# Patient Record
Sex: Female | Born: 1980
Health system: Southern US, Community
[De-identification: ages and names within clinical notes are randomized; demographics above are authoritative.]

## PROBLEM LIST (undated history)

## (undated) DIAGNOSIS — Z9889 Other specified postprocedural states: Secondary | ICD-10-CM

## (undated) DIAGNOSIS — F419 Anxiety disorder, unspecified: Secondary | ICD-10-CM

## (undated) DIAGNOSIS — R87629 Unspecified abnormal cytological findings in specimens from vagina: Secondary | ICD-10-CM

## (undated) HISTORY — PX: EYE SURGERY: SHX253

## (undated) HISTORY — PX: COLPOSCOPY: SHX161

---
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
## 2001-06-04 ENCOUNTER — Other Ambulatory Visit: Admission: RE | Admit: 2001-06-04 | Discharge: 2001-06-04 | Payer: Self-pay | Admitting: Obstetrics and Gynecology

## 2002-07-14 ENCOUNTER — Other Ambulatory Visit: Admission: RE | Admit: 2002-07-14 | Discharge: 2002-07-14 | Payer: Self-pay | Admitting: Obstetrics and Gynecology

## 2003-08-08 ENCOUNTER — Other Ambulatory Visit: Admission: RE | Admit: 2003-08-08 | Discharge: 2003-08-08 | Payer: Self-pay | Admitting: Obstetrics and Gynecology

## 2003-11-23 ENCOUNTER — Other Ambulatory Visit: Admission: RE | Admit: 2003-11-23 | Discharge: 2003-11-23 | Payer: Self-pay | Admitting: Obstetrics and Gynecology

## 2004-12-12 ENCOUNTER — Other Ambulatory Visit: Admission: RE | Admit: 2004-12-12 | Discharge: 2004-12-12 | Payer: Self-pay | Admitting: Obstetrics and Gynecology

## 2014-12-22 ENCOUNTER — Inpatient Hospital Stay (HOSPITAL_COMMUNITY)
Admission: AD | Admit: 2014-12-22 | Discharge: 2014-12-22 | Disposition: A | Discharge status: Home / Self Care | Payer: BLUE CROSS/BLUE SHIELD | Source: Ambulatory Visit | Attending: Obstetrics and Gynecology | Admitting: Obstetrics and Gynecology

## 2014-12-22 ENCOUNTER — Encounter (HOSPITAL_COMMUNITY): Payer: Self-pay | Admitting: *Deleted

## 2014-12-22 DIAGNOSIS — O208 Other hemorrhage in early pregnancy: Secondary | ICD-10-CM | POA: Diagnosis not present

## 2014-12-22 DIAGNOSIS — R300 Dysuria: Secondary | ICD-10-CM | POA: Insufficient documentation

## 2014-12-22 DIAGNOSIS — Z3A11 11 weeks gestation of pregnancy: Secondary | ICD-10-CM | POA: Insufficient documentation

## 2014-12-22 DIAGNOSIS — O209 Hemorrhage in early pregnancy, unspecified: Secondary | ICD-10-CM

## 2014-12-22 DIAGNOSIS — O4691 Antepartum hemorrhage, unspecified, first trimester: Secondary | ICD-10-CM

## 2014-12-22 DIAGNOSIS — Z87891 Personal history of nicotine dependence: Secondary | ICD-10-CM | POA: Insufficient documentation

## 2014-12-22 DIAGNOSIS — N939 Abnormal uterine and vaginal bleeding, unspecified: Secondary | ICD-10-CM | POA: Diagnosis present

## 2014-12-22 DIAGNOSIS — R350 Frequency of micturition: Secondary | ICD-10-CM

## 2014-12-22 HISTORY — DX: Unspecified abnormal cytological findings in specimens from vagina: R87.629

## 2014-12-22 LAB — URINALYSIS, ROUTINE W REFLEX MICROSCOPIC
Bilirubin Urine: NEGATIVE
Glucose, UA: NEGATIVE mg/dL
Ketones, ur: NEGATIVE mg/dL
Leukocytes, UA: NEGATIVE
Nitrite: NEGATIVE
Protein, ur: NEGATIVE mg/dL
Specific Gravity, Urine: <1.005 — ABNORMAL LOW (ref 1.005–1.030)
Urobilinogen, UA: 0.2 mg/dL (ref 0.0–1.0)
pH: 6 (ref 5.0–8.0)

## 2014-12-22 LAB — URINE MICROSCOPIC-ADD ON

## 2014-12-22 NOTE — MAU Note (Signed)
Pt stated she started having some bleeding today. Has been having some pain and cramping and some burning with urination. Stated she feels like she may have a UTI. Reports she has been told she had a subchorionic hemorrhage  with this pregnancy and has 2 other episodes of bleeding.

## 2014-12-22 NOTE — Discharge Instructions (Signed)
Pelvic Rest °Pelvic rest is sometimes recommended for women when:  °· The placenta is partially or completely covering the opening of the cervix (placenta previa). °· There is bleeding between the uterine wall and the amniotic sac in the first trimester (subchorionic hemorrhage). °· The cervix begins to open without labor starting (incompetent cervix, cervical insufficiency). °· The labor is too early (preterm labor). °HOME CARE INSTRUCTIONS °· Do not have sexual intercourse, stimulation, or an orgasm. °· Do not use tampons, douche, or put anything in the vagina. °· Do not lift anything over 10 pounds (4.5 kg). °· Avoid strenuous activity or straining your pelvic muscles. °SEEK MEDICAL CARE IF:  °· You have any vaginal bleeding during pregnancy. Treat this as a potential emergency. °· You have cramping pain felt low in the stomach (stronger than menstrual cramps). °· You notice vaginal discharge (watery, mucus, or bloody). °· You have a low, dull backache. °· There are regular contractions or uterine tightening. °SEEK IMMEDIATE MEDICAL CARE IF: °You have vaginal bleeding and have placenta previa.  °Document Released: 08/02/2010 Document Revised: 06/30/2011 Document Reviewed: 08/02/2010 °ExitCare® Patient Information ©2015 [employer]. This information is not intended to replace advice given to you by your health care provider. Make sure you discuss any questions you have with your health care provider. ° °Vaginal Bleeding During Pregnancy, First Trimester °A small amount of bleeding (spotting) from the vagina is common in early pregnancy. Sometimes the bleeding is normal and is not a problem, and sometimes it is a sign of something serious. Be sure to tell your doctor about any bleeding from your vagina right away. °HOME CARE °· Watch your condition for any changes. °· Follow your doctor's instructions about how active you can be. °· If you are on bed rest: °¨ You may need to stay in bed and only get up to use the  bathroom. °¨ You may be allowed to do some activities. °¨ If you need help, make plans for someone to help you. °· Write down: °¨ The number of pads you use each day. °¨ How often you change pads. °¨ How soaked (saturated) your pads are. °· Do not use tampons. °· Do not douche. °· Do not have sex or orgasms until your doctor says it is okay. °· If you pass any tissue from your vagina, save the tissue so you can show it to your doctor. °· Only take medicines as told by your doctor. °· Do not take aspirin because it can make you bleed. °· Keep all follow-up visits as told by your doctor. °GET HELP IF:  °· You bleed from your vagina. °· You have cramps. °· You have labor pains. °· You have a fever that does not go away after you take medicine. °GET HELP RIGHT AWAY IF:  °· You have very bad cramps in your back or belly (abdomen). °· You pass large clots or tissue from your vagina. °· You bleed more. °· You feel light-headed or weak. °· You pass out (faint). °· You have chills. °· You are leaking fluid or have a gush of fluid from your vagina. °· You pass out while pooping (having a bowel movement). °MAKE SURE YOU: °· Understand these instructions. °· Will watch your condition. °· Will get help right away if you are not doing well or get worse. °Document Released: 08/22/2013 Document Reviewed: 12/13/2012 °ExitCare® Patient Information ©2015 [employer]. This information is not intended to replace advice given to you by your health care provider. Make   sure you discuss any questions you have with your health care provider. ° °

## 2014-12-22 NOTE — MAU Provider Note (Signed)
History     CSN: [id number]  Arrival date and time: 12/22/14 1502   First Provider Initiated Contact with Patient 12/22/14 1545      No chief complaint on file.  Patient is a 34 y.o. female presenting with vaginal bleeding. The history is provided by the patient.  Vaginal Bleeding This is a recurrent problem. The current episode started today. The problem occurs intermittently. The problem has been gradually improving. Associated symptoms include abdominal pain. Pertinent negatives include no chills, fever, nausea or vomiting. Nothing aggravates the symptoms. She has tried nothing for the symptoms.   This is a 34 y.o. female at [redacted]w[redacted]d who presents with c/o vaginal bleeding.  Also has been having some cramping and some dysuria. Has a known subchorionic hemorrhage and was told it was smaller on most recent US than the first [place].   RN Note: Pt stated she started having some bleeding today. Has been having some pain and cramping and some burning with urination. Stated she feels like she may have a UTI. Reports she has been told she had a subchorionic hemorrhage with this pregnancy and has 2 other episodes of bleeding.           OB History    Gravida Para Term Preterm AB TAB SAB Ectopic Multiple Living   Past Medical History  Diagnosis Date  . Medical history non-contributory   . Vaginal Pap smear, abnormal     Past Surgical History  Procedure Laterality Date  . Colposcopy      No family history on file.  Social History  Substance Use Topics  . Smoking status: Former Smoker    Quit date: 04/23/1999  . Smokeless tobacco: None  . Alcohol Use: No    Allergies: Allergies not on file  No prescriptions prior to admission    Review of Systems  Constitutional: Negative for fever, chills and malaise/fatigue.  Gastrointestinal: Positive for abdominal pain. Negative for nausea, vomiting, diarrhea and constipation.  Genitourinary: Positive for dysuria and  vaginal bleeding. Negative for urgency, frequency, hematuria and flank pain.   Physical Exam   Blood pressure 131/77, pulse 78, resp. rate 18, height  (1.6 m), last menstrual period 09/30/2014.  Physical Exam  Constitutional: She is oriented to person, place, and time. She appears well-developed and well-nourished. No distress.  HENT:  Head: Normocephalic.  Cardiovascular: Normal rate and regular rhythm.   Respiratory: Effort normal. No respiratory distress.  GI: Soft. She exhibits no distension and no mass. There is no tenderness. There is no rebound and no guarding.  Genitourinary: Vaginal discharge (small amount of dark red blood in vault) found.  Doppler FHTs obtained by RN Informal bedside US shows active fetus  Musculoskeletal: Normal range of motion.  Neurological: She is alert and oriented to person, place, and time.  Skin: Skin is warm and dry.  Psychiatric: She has a normal mood and affect.    MAU Course  Procedures  MDM Results for orders placed or performed during the hospital encounter of 12/22/14 (from the past 24 hour(s))  Urinalysis, Routine w reflex microscopic (not at [facility])     Status: Abnormal   Collection Time: 12/22/14  3:00 PM  Result Value Ref Range   Color, Urine STRAW (A) YELLOW   APPearance CLEAR CLEAR   Specific Gravity, Urine <1.005 (L) 1.005 - 1.030   pH 6.0 5.0 - 8.0   Glucose, UA NEGATIVE  NEGATIVE mg/dL   Hgb urine dipstick LARGE (A) NEGATIVE   Bilirubin Urine NEGATIVE NEGATIVE   Ketones, ur NEGATIVE NEGATIVE mg/dL   Protein, ur NEGATIVE NEGATIVE mg/dL   Urobilinogen, UA 0.2 0.0 - 1.0 mg/dL   Nitrite NEGATIVE NEGATIVE   Leukocytes, UA NEGATIVE NEGATIVE  Urine microscopic-add on     Status: Abnormal   Collection Time: 12/22/14  3:00 PM  Result Value Ref Range   Squamous Epithelial / LPF FEW (A) RARE     Assessment and Plan  A:  SIUP at [redacted]w[redacted]d       First trimester bleeding in setting of known subchorionic hemorrhage       Dysuria,  with no evidence of UTI  P:  Consulted Dr Arelia Sneddon regarding presentation , history, exam findings and test results.        DIscharge home       BLeeding precautions.        Follow up in office for repeat US next week as scheduled   [facility] 12/22/2014, 3:45 PM

## 2014-12-27 ENCOUNTER — Inpatient Hospital Stay (HOSPITAL_COMMUNITY)
Admission: AD | Admit: 2014-12-27 | Payer: BLUE CROSS/BLUE SHIELD | Source: Ambulatory Visit | Admitting: Obstetrics and Gynecology

## 2015-03-27 ENCOUNTER — Inpatient Hospital Stay (HOSPITAL_COMMUNITY): Payer: BLUE CROSS/BLUE SHIELD

## 2015-03-27 ENCOUNTER — Encounter (HOSPITAL_COMMUNITY): Payer: Self-pay | Admitting: *Deleted

## 2015-03-27 ENCOUNTER — Inpatient Hospital Stay (HOSPITAL_COMMUNITY)
Admission: AD | Admit: 2015-03-27 | Discharge: 2015-04-02 | DRG: 781 | Disposition: A | Discharge status: Home / Self Care | Payer: BLUE CROSS/BLUE SHIELD | Source: Ambulatory Visit | Attending: Obstetrics and Gynecology | Admitting: Obstetrics and Gynecology

## 2015-03-27 DIAGNOSIS — Z833 Family history of diabetes mellitus: Secondary | ICD-10-CM

## 2015-03-27 DIAGNOSIS — O42912 Preterm premature rupture of membranes, unspecified as to length of time between rupture and onset of labor, second trimester: Secondary | ICD-10-CM | POA: Diagnosis present

## 2015-03-27 DIAGNOSIS — O42919 Preterm premature rupture of membranes, unspecified as to length of time between rupture and onset of labor, unspecified trimester: Secondary | ICD-10-CM | POA: Diagnosis present

## 2015-03-27 DIAGNOSIS — O4402 Placenta previa specified as without hemorrhage, second trimester: Secondary | ICD-10-CM | POA: Diagnosis not present

## 2015-03-27 DIAGNOSIS — Z3A25 25 weeks gestation of pregnancy: Secondary | ICD-10-CM

## 2015-03-27 DIAGNOSIS — Z87891 Personal history of nicotine dependence: Secondary | ICD-10-CM | POA: Diagnosis not present

## 2015-03-27 DIAGNOSIS — Z8249 Family history of ischemic heart disease and other diseases of the circulatory system: Secondary | ICD-10-CM | POA: Diagnosis not present

## 2015-03-27 DIAGNOSIS — O26892 Other specified pregnancy related conditions, second trimester: Secondary | ICD-10-CM | POA: Diagnosis present

## 2015-03-27 DIAGNOSIS — Z3A26 26 weeks gestation of pregnancy: Secondary | ICD-10-CM

## 2015-03-27 DIAGNOSIS — O4290 Premature rupture of membranes, unspecified as to length of time between rupture and onset of labor, unspecified weeks of gestation: Secondary | ICD-10-CM

## 2015-03-27 HISTORY — DX: Anxiety disorder, unspecified: F41.9

## 2015-03-27 HISTORY — DX: Other specified postprocedural states: Z98.890

## 2015-03-27 LAB — URINALYSIS, ROUTINE W REFLEX MICROSCOPIC
Bilirubin Urine: NEGATIVE
Glucose, UA: NEGATIVE mg/dL
Hgb urine dipstick: NEGATIVE
Ketones, ur: NEGATIVE mg/dL
Leukocytes, UA: NEGATIVE
Nitrite: NEGATIVE
Protein, ur: NEGATIVE mg/dL
Specific Gravity, Urine: 1.01 (ref 1.005–1.030)
pH: 7 (ref 5.0–8.0)

## 2015-03-27 LAB — ABO/RH: ABO/RH(D): A POS

## 2015-03-27 LAB — AMNISURE RUPTURE OF MEMBRANE (ROM) NOT AT ARMC: Amnisure ROM: POSITIVE

## 2015-03-27 LAB — CBC
HCT: 35.5 % — ABNORMAL LOW (ref 36.0–46.0)
Hemoglobin: 12.3 g/dL (ref 12.0–15.0)
MCH: 31.5 pg (ref 26.0–34.0)
MCHC: 34.6 g/dL (ref 30.0–36.0)
MCV: 91 fL (ref 78.0–100.0)
Platelets: 217 10*3/uL (ref 150–400)
RBC: 3.9 MIL/uL (ref 3.87–5.11)
RDW: 13.3 % (ref 11.5–15.5)
WBC: 12.9 10*3/uL — ABNORMAL HIGH (ref 4.0–10.5)

## 2015-03-27 LAB — TYPE AND SCREEN
ABO/RH(D): A POS
Antibody Screen: NEGATIVE

## 2015-03-27 LAB — WET PREP, GENITAL
Clue Cells Wet Prep HPF POC: NONE SEEN
Sperm: NONE SEEN
Trich, Wet Prep: NONE SEEN

## 2015-03-27 MED ORDER — DOCUSATE SODIUM 100 MG PO CAPS
100.0000 mg | ORAL_CAPSULE | Freq: Every day | ORAL | Status: DC
  Administered 2015-03-28 – 2015-04-02 (×6): 100 mg via ORAL
  Filled 2015-03-27 (×7): qty 1

## 2015-03-27 MED ORDER — AMOXICILLIN 500 MG PO CAPS
500.0000 mg | ORAL_CAPSULE | Freq: Three times a day (TID) | ORAL | Status: DC
  Administered 2015-03-29 – 2015-04-02 (×14): 500 mg via ORAL
  Filled 2015-03-27 (×16): qty 1

## 2015-03-27 MED ORDER — SODIUM CHLORIDE 0.9 % IV SOLN
2.0000 g | Freq: Four times a day (QID) | INTRAVENOUS | Status: AC
  Administered 2015-03-27 – 2015-03-29 (×8): 2 g via INTRAVENOUS
  Filled 2015-03-27 (×8): qty 2000

## 2015-03-27 MED ORDER — HOME MED STORE IN PYXIS: 1.0000 | Freq: Two times a day (BID) | Status: DC

## 2015-03-27 MED ORDER — BETAMETHASONE SOD PHOS & ACET 6 (3-3) MG/ML IJ SUSP
12.0000 mg | Freq: Once | INTRAMUSCULAR | Status: DC
  Administered 2015-03-27: 12 mg via INTRAMUSCULAR
  Filled 2015-03-27: qty 2

## 2015-03-27 MED ORDER — DEXTROSE 5 % IV SOLN
500.0000 mg | INTRAVENOUS | Status: AC
  Administered 2015-03-27 – 2015-03-28 (×2): 500 mg via INTRAVENOUS
  Filled 2015-03-27 (×2): qty 500

## 2015-03-27 MED ORDER — HOME MED STORE IN PYXIS: 1.0000 | Freq: Every morning | Status: DC

## 2015-03-27 MED ORDER — ZOLPIDEM TARTRATE 5 MG PO TABS: 5.0000 mg | ORAL_TABLET | Freq: Every evening | ORAL | Status: DC | PRN

## 2015-03-27 MED ORDER — PRENATAL+DHA 28-0.975 & 200 MG PO MISC: 1.0000 | Freq: Two times a day (BID) | ORAL | Status: DC

## 2015-03-27 MED ORDER — ACETAMINOPHEN 325 MG PO TABS
650.0000 mg | ORAL_TABLET | ORAL | Status: DC | PRN
Start: 2015-03-27 — End: 2015-04-02

## 2015-03-27 MED ORDER — PRENATAL MULTIVITAMIN CH
1.0000 | ORAL_TABLET | Freq: Every day | ORAL | Status: DC
  Administered 2015-04-01: 1 via ORAL
  Filled 2015-03-27 (×5): qty 1

## 2015-03-27 MED ORDER — CALCIUM CARBONATE ANTACID 500 MG PO CHEW
2.0000 | CHEWABLE_TABLET | ORAL | Status: DC | PRN
  Filled 2015-03-27: qty 2

## 2015-03-27 MED ORDER — BETAMETHASONE SOD PHOS & ACET 6 (3-3) MG/ML IJ SUSP
12.0000 mg | INTRAMUSCULAR | Status: AC
  Administered 2015-03-28: 12 mg via INTRAMUSCULAR
  Filled 2015-03-27 (×2): qty 2

## 2015-03-27 MED ORDER — AZITHROMYCIN 250 MG PO TABS
500.0000 mg | ORAL_TABLET | Freq: Every day | ORAL | Status: AC
  Administered 2015-03-29 – 2015-04-02 (×5): 500 mg via ORAL
  Filled 2015-03-27 (×5): qty 2

## 2015-03-27 MED ORDER — DOCOSAHEXAENOIC ACID 300 MG PO CAPS
1.0000 | ORAL_CAPSULE | Freq: Two times a day (BID) | ORAL | Status: DC
  Administered 2015-03-27 – 2015-04-02 (×12): 1 via ORAL

## 2015-03-27 NOTE — MAU Provider Note (Signed)
  History     CSN: [id number]  Arrival date and time: 03/27/15 0345   First Provider Initiated Contact with Patient 03/27/15 [phone]      Chief Complaint  Patient presents with  . Vaginal Bleeding   Vaginal Bleeding The patient's primary symptoms include vaginal bleeding and vaginal discharge. This is a new problem. The current episode started today (around 0300 ). The problem occurs intermittently. The problem has been unchanged. The patient is experiencing no pain. She is pregnant. The vaginal discharge was brown and mucoid. The vaginal bleeding is spotting. She has not been passing clots. She has not been passing tissue. Nothing aggravates the symptoms. She has tried nothing for the symptoms. Sexual activity: on pelvic rest     Past Medical History  Diagnosis Date  . Medical history non-contributory   . Vaginal Pap smear, abnormal     Past Surgical History  Procedure Laterality Date  . Colposcopy    . Eye surgery      as child    No family history on file.  Social History  Substance Use Topics  . Smoking status: Former Smoker    Quit date: 04/23/1999  . Smokeless tobacco: None  . Alcohol Use: No    Allergies: No Known Allergies  Prescriptions prior to admission  Medication Sig Dispense Refill Last Dose  . Docosahexaenoic Acid (PRENATAL DHA PO) Take 1 tablet by mouth 2 (two) times daily.   12/22/2014 at Unknown time  . Prenatal Vit-Fe Fumarate-FA (PRENATAL MULTIVITAMIN) TABS tablet Take 1 tablet by mouth daily at 12 noon.   12/22/2014 at Unknown time    Review of Systems  Genitourinary: Positive for vaginal bleeding and vaginal discharge.   Physical Exam   Blood pressure 113/67, pulse 79, temperature 98.3 F (36.8 C), temperature source Oral, resp. rate 20, height 5\' 4"  (1.626 m), weight 75.467 kg (166 lb 6 oz), last menstrual period 09/30/2014.  Physical Exam  Nursing note and vitals reviewed. Constitutional: She is oriented to person, place, and time. She appears  well-developed and well-nourished. No distress.  HENT:  Head: Normocephalic.  Cardiovascular: Normal rate.   Respiratory: Effort normal.  GI: Soft. There is no tenderness. There is no rebound.  Genitourinary:   External: no lesion Vagina: large amount of thick, yellow discharge.   Cervix: some ectropion to the cervix  Uterus: AGA    Neurological: She is alert and oriented to person, place, and time.  Skin: Skin is warm and dry.  Psychiatric: She has a normal mood and affect.   FHT: 135, moderate with 10x10 accels, occasional variable.  Toco: no UCs   US: AFI 10.67, 14%tile Cervical length 3.9cm Anterior previa seen   MAU Course  Procedures  MDM 0600: D/W Dr. Vincente PoliGrewal, will get US for AFI. 0658: Reviewed US results with Dr. Vincente PoliGrewal, will admit to ante for abx and steroids.  Assessment and Plan  PPROM Admit to antenatal  Steriods Abx   Tawnya CrookHogan, Heather Donovan 03/27/2015, 4:59 AM

## 2015-03-27 NOTE — Consult Note (Signed)
Maternal Fetal Medicine Consultation  Requesting Provider(s): Harold Hedge, MD  Reason for consultation: Placenta previa, suspected PROM  HPI: Colleen Dalton is a 34 yo G4P0030, EDD 07/07/2015 who is currently at 25w 3d seen for consultation due to known anterior placenta previa and suspected PROM. Colleen Dalton reports some vaginal spotting and discharge last night.  On evaluation, she was noted to have a mucous discharge suspicious for yeast vaginitis and positive amnisure test.  Limited ultrasound showed an AFI of 10 cm (~14th %tile).  She otherwise denies leakage of fluid or other symptoms suspicious for ruptured membranes.  She is otherwise without complaints.  The patient was admitted, started on latency antibiotics and given a course of betamethasone for PROM.  Her prenatal course has otherwise been uncomplicated.  OB History: OB History    Gravida Para Term Preterm AB TAB SAB Ectopic Multiple Living   PMH:  Past Medical History  Diagnosis Date  . Vaginal Pap smear, abnormal   . Anxiety   . History of cryosurgery     PSH:  Past Surgical History  Procedure Laterality Date  . Colposcopy    . Eye surgery      as child   Meds:  Scheduled Meds: . ampicillin (OMNIPEN) IV  2 g Intravenous Q6H   Followed by  . [START ON 03/29/2015] amoxicillin  500 mg Oral Q8H  . azithromycin  500 mg Intravenous Q24H   Followed by  . [START ON 03/29/2015] azithromycin  500 mg Oral Daily  . betamethasone acetate-betamethasone sodium phosphate  12 mg Intramuscular Q24H  . Docosahexaenoic Acid  1 capsule Oral BID WC  . docusate sodium  100 mg Oral Daily  . prenatal multivitamin  1 tablet Oral Q1200   Continuous Infusions:  PRN Meds:.acetaminophen, calcium carbonate, zolpidem   Allergies: No Known Allergies   FH:  Family History  Problem Relation Age of Onset  . Asthma Father   . Cancer Father     prostate and colon  . Deep vein thrombosis Maternal Aunt   . Cancer Maternal  Aunt     x2 aunts thyroid cancer  . Alcohol abuse Paternal Aunt   . Alcohol abuse Paternal Uncle   . Asthma Maternal Grandmother   . Kidney disease Maternal Grandmother   . Heart disease Maternal Grandmother   . Diabetes Maternal Grandmother   . Kidney disease Maternal Grandfather    Soc:  Social History   Social History  . Marital Status: Married    Spouse Name: N/A  . Number of Children: N/A  . Years of Education: N/A   Occupational History  . Not on file.   Social History Main Topics  . Smoking status: Former Smoker    Quit date: 04/23/1999  . Smokeless tobacco: Not on file  . Alcohol Use: No  . Drug Use: No  . Sexual Activity: Yes    Birth Control/ Protection: None   Other Topics Concern  . Not on file   Social History Narrative   Review of Systems: no vaginal bleeding or cramping/contractions, no LOF, no nausea/vomiting. All other systems reviewed and are negative.  PE:   Filed Vitals:   03/27/15 0808 03/27/15 1150  BP: 106/56 120/74  Pulse: 66 92  Temp: 98 F (36.7 C) 98.4 F (36.9 C)  Resp: 16 16    Labs: CBC    Component Value Date/Time   WBC 12.9* 03/27/2015  0720   RBC 3.90 03/27/2015 0720   HGB 12.3 03/27/2015 0720   HCT 35.5* 03/27/2015 0720   PLT 217 03/27/2015 0720   MCV 91.0 03/27/2015 0720   MCH 31.5 03/27/2015 0720   MCHC 34.6 03/27/2015 0720   RDW 13.3 03/27/2015 0720    A/P: 1) Single IUP at 25w 3d  2) Anterior placenta previa  3) Suspected PROM -  It is difficult to dismiss the positive amnisure test, and feel that admission for latency antibiotics and betamethasone is reasonable.  The patient currently does not have any other clinical findings suggestive of PROM.  Would consider repeating AFI and sterile speculum exam and amnisure test once latency antibiotics are complete - I would expect that if she is indeed ruptured she would declare herself (i.e. worsening oligohydramnios or additional findings on speculum exam).  We briefly  discussed risks and management of PROM to include risks of infection, labor, cord prolapse, abruption as well as the potential need for classical cesarean delivery in the event that delivery were required.  The patient had a number of questions regarding neonatal outcomes - would consider NICU consult, particularly if the diagnosis of PROM is confirmed.    Thank you for the opportunity to be a part of the care of Colleen Dalton. Please contact our office if we can be of further assistance.   I spent approximately 30 minutes with this patient with over 50% of time spent in face-to-face counseling.  Alpha GulaPaul Maddix Kliewer, MD Maternal Fetal Medicine

## 2015-03-27 NOTE — MAU Note (Addendum)
AT 0300     NOTICED   BROWN D/C WHEN SHE  WIPED- DIME SIZED  AMT .    HAD BAD SMELL.    HAS    COMPLETE   PLACENTA  PREVIA  -   NO   SEX  SINCE 18 WEEKS.  CALLED  NURSE  LINE-  TOLD  TO COME.      FEELS   LOWER  ABD  CRAMPING.- PUT  WARM COMPRESS.

## 2015-03-27 NOTE — Progress Notes (Signed)
No further D/C or leaking/bleeding  VSS Afeb  Appreciate Dr Fredda HammedWhitecar's consultation  A/P: toco monitor tonight         NST q shift        Finish latency ATB        BMTZ        U/S tomorrow        Neonatology consult for tomorrow

## 2015-03-27 NOTE — H&P (Signed)
Colleen Dalton is a 34 y.o. female presenting for Thick dark, but not bloody,gelatinous discharge about 3 am. Has recurred intermittently. No gushes , no bright red or dark red discharge. No trauma. Slight pulling in LLQ on/off but no uterine cramping. Known placenta previa. Maternal Medical History:  Reason for admission: Rupture of membranes.   Contractions: Onset was 3-5 hours ago.    Fetal activity: Perceived fetal activity is normal.    Prenatal complications: No bleeding.     OB History    Gravida Para Term Preterm AB TAB SAB Ectopic Multiple Living   3    2  2         Past Medical History  Diagnosis Date  . Medical history non-contributory   . Vaginal Pap smear, abnormal    Past Surgical History  Procedure Laterality Date  . Colposcopy    . Eye surgery      as child   Family History: family history includes Alcohol abuse in her paternal aunt and paternal uncle; Asthma in her father and maternal grandmother; Deep vein thrombosis in her maternal aunt; Diabetes in her maternal grandmother; Heart disease in her maternal grandmother; Kidney disease in her maternal grandfather and maternal grandmother. Social History:  reports that she quit smoking about 15 years ago. She does not have any smokeless tobacco history on file. She reports that she does not drink alcohol or use illicit drugs.   Prenatal Transfer Tool  Maternal Diabetes: No Genetic Screening: Normal Maternal Ultrasounds/Referrals: Abnormal:  Findings:   Other:placenta previa Fetal Ultrasounds or other Referrals:  None Maternal Substance Abuse:  No Significant Maternal Medications:  None Significant Maternal Lab Results:  None Other Comments:  None  Review of Systems  Eyes: Negative for blurred vision.  Gastrointestinal: Negative for abdominal pain.  Neurological: Negative for headaches.      Blood pressure 106/56, pulse 66, temperature 98 F (36.7 C), temperature source Oral, resp. rate 16, height 5\' 4"   (1.626 m), weight 166 lb 6 oz (75.467 kg), last menstrual period 09/30/2014.   Fetal Exam Fetal Monitor Review: Pattern: accelerations present.       Physical Exam  Cardiovascular: Normal rate and regular rhythm.   Respiratory: Effort normal and breath sounds normal.  GI: Soft. There is no tenderness.  Neurological: She has normal reflexes.    Exam in MAU noted thick yellow vaginal discharge US: AFI 10.67, 14%tile Cervical length 3.9cm Anterior previa seen  Written report pending  Prenatal labs: ABO, Rh:   Antibody:   Rubella:   RPR:    HBsAg:    HIV:    GBS:     Assessment/Plan: 34 yo G3P0 @ 25 3/7 weeks with placenta previa and possible PROM Admit to antenatal IV fluids Modified BR ATB per protocol BMTZ Repeat U/S to check AFI tomorrow MFM consult D/W patient and husband above, D/W C/S for delivery-probable classical uterine incision with repeat C/S in future, possible bleeding with transfusion and HIV/Hep risk, possible hysterectomy   Starr Urias II,Jonanthony Nahar E 03/27/2015, 8:22 AM

## 2015-03-28 ENCOUNTER — Inpatient Hospital Stay (HOSPITAL_COMMUNITY): Payer: BLUE CROSS/BLUE SHIELD

## 2015-03-28 MED ORDER — SODIUM CHLORIDE 0.9 % IJ SOLN
3.0000 mL | INTRAMUSCULAR | Status: DC | PRN
  Administered 2015-03-28: 3 mL via INTRAVENOUS
  Filled 2015-03-28: qty 3

## 2015-03-28 MED ORDER — LACTATED RINGERS IV SOLN
INTRAVENOUS | Status: DC
  Administered 2015-03-28: 08:00:00 via INTRAVENOUS

## 2015-03-28 MED ORDER — SODIUM CHLORIDE 0.9 % IJ SOLN
3.0000 mL | Freq: Two times a day (BID) | INTRAMUSCULAR | Status: DC
  Administered 2015-03-29 – 2015-03-30 (×3): 3 mL via INTRAVENOUS

## 2015-03-28 NOTE — Progress Notes (Signed)
IVF's initiated to hang with antibiotics.

## 2015-03-28 NOTE — Progress Notes (Signed)
7033w4d  S// no signif further D/C or leaking, just returned from US  O//BP 108/66 mmHg  Pulse 84  Temp(Src) 98.4 F (36.9 C) (Oral)  Resp 18  Ht 5\' 4"  (1.626 m)  Wt 166 lb 6 oz (75.467 kg)  BMI 28.54 kg/m2  LMP 09/30/2014 CBC    Component Value Date/Time   WBC 12.9* 03/27/2015 0720   RBC 3.90 03/27/2015 0720   HGB 12.3 03/27/2015 0720   HCT 35.5* 03/27/2015 0720   PLT 217 03/27/2015 0720   MCV 91.0 03/27/2015 0720   MCH 31.5 03/27/2015 0720   MCHC 34.6 03/27/2015 0720   RDW 13.3 03/27/2015 0720    A+P//5533w4d, ant plac previa with questionable PPROM  Per MFM>>>   " 1) Single IUP at 25w 3d 2) Anterior placenta previa 3) Suspected PROM - It is difficult to dismiss the positive amnisure test, and feel that admission for latency antibiotics and betamethasone is reasonable. The patient currently does not have any other clinical findings suggestive of PROM. Would consider repeating AFI and sterile speculum exam and amnisure test once latency antibiotics are complete

## 2015-03-29 NOTE — Progress Notes (Signed)
Patient ID: Henrietta HooverArielle Benninger, female   DOB: 1980-08-13, 34 y.o.   MRN: [id number] Pt without complaints GFM No bleeding or leaking occas mucous  VSSAF FHR 140s no decels Ctxs none  Abd Gravid, nt Neg homans  IUP at 25 5/7 Anterior Previa - stable without bleeding ? PPROM - S/P BMZ series, currently on latency abx.  No leaking of fluid and normal AFI Plan to recheck sterile spec and Amnisure after completion of latency abx (per MFM)  DL

## 2015-03-30 ENCOUNTER — Encounter (HOSPITAL_COMMUNITY): Payer: Self-pay | Admitting: General Surgery

## 2015-03-30 LAB — TYPE AND SCREEN
ABO/RH(D): A POS
Antibody Screen: NEGATIVE

## 2015-03-30 NOTE — Progress Notes (Signed)
Pt denies vb, ctx, & lof.  + FM  AF, VSS Gen - NAD Abd - gravid, NT PV - deferred  A/P:  Placenta previa, questionable PPROM S/p BMZ Continue latency abx x 7days Rpt US, amniosure after latency abx monday

## 2015-03-31 NOTE — Progress Notes (Signed)
Pt c/o increase mucus d/c yesterday;  No further vb.  + FM  AF, VSS Gen - NAD Abd - gravid, NT Ext - NT, no edema  A/P:  Previa, ? PPROM Continue 7 days latency abx, rpt amniosure Monday morning. S/p BMZ

## 2015-04-01 NOTE — Progress Notes (Signed)
Pt continues to have occasional mucus fluid discharge.  No VB.  No pain  AF, VSS Gen - NAD Abd - gravid, NT Ext - NT, SCDs on  A/P:  Placenta previa, PPROM Plan to complete latency abx today  Rpt amniosure tomorrow to make plan of care (?PPROM) S/p BMZ

## 2015-04-02 ENCOUNTER — Inpatient Hospital Stay (HOSPITAL_COMMUNITY): Payer: BLUE CROSS/BLUE SHIELD

## 2015-04-02 LAB — AMNISURE RUPTURE OF MEMBRANE (ROM) NOT AT ARMC: Amnisure ROM: NEGATIVE

## 2015-04-02 LAB — TYPE AND SCREEN
ABO/RH(D): A POS
Antibody Screen: NEGATIVE

## 2015-04-02 MED ORDER — AMOXICILLIN 500 MG PO CAPS: 500.0000 mg | ORAL_CAPSULE | Freq: Three times a day (TID) | ORAL | Status: DC

## 2015-04-02 NOTE — Discharge Summary (Signed)
Physician Discharge Summary  Patient ID: Colleen Dalton MRN: [id number] DOB/AGE: January 30, 1981 34 y.o.  Admit date: 03/27/2015 Discharge date: 04/02/2015  Admission Diagnoses:Placenta Previa                                        PPROM  Discharge Diagnoses:  Active Problems:   Preterm premature rupture of membranes (PPROM) with unknown onset of labor Placenta Previa  Discharged Condition: stable  Hospital Course: patient presented to MAU with known placenta previa and C/O heavy mucousy discharge. No BRB or pain. MAU evaluation noted white discharge on speculum exam, no pool and positive amniosure. U/S showed AFI about 14%. Admitted for latency antibiotics, BMTZ, IV fluids and MBR. Patient had no more C/O of leaking/bleeding. U/S on Day 2 of admission showed AFI about 25%. On Day of discharge, amniosure negative and U/S again showed stable AFI @ 24% (11.7) and vertex. MFM consulted upon admission.   Consults: MFM  Significant Diagnostic Studies: labs:  Results for orders placed or performed during the hospital encounter of 03/27/15 (from the past 72 hour(s))  Type [facility]     Status: None   Collection Time: 03/30/15  4:10 PM  Result Value Ref Range   ABO/RH(D) A POS    Antibody Screen NEG    Sample Expiration 04/02/2015   Amnisure rupture [facility] membrane (rom)not [facility]     Status: None   Collection Time: 04/02/15  8:15 AM  Result Value Ref Range   Amnisure ROM NEGATIVE   Type and screen [facility]     Status: None   Collection Time: 04/02/15  9:26 AM  Result Value Ref Range   ABO/RH(D) A POS    Antibody Screen NEG    Sample Expiration 04/05/2015     Treatments: IV hydration and antibiotics: azithromycin and amoxicillin  Discharge Exam: Blood pressure 108/74, pulse 74, temperature 98.5 F (36.9 C), temperature source Oral, resp. rate 16, height 5\' 4"  (1.626 m), weight 161 lb 9.6 oz (73.301 kg), last menstrual period 09/30/2014,  SpO2 99 %. General appearance: alert, cooperative and no distress GI: soft, non-tender; bowel sounds normal; no masses,  no organomegaly  Disposition: 01-Home or Self Care     Medication List    STOP taking these medications        PNV PO      TAKE these medications        amoxicillin 500 MG capsule  Commonly known as:  AMOXIL  Take 1 capsule (500 mg total) by mouth every 8 (eight) hours.     PRENATAL DHA PO  Take 1 tablet by mouth 2 (two) times daily.         Signed: Retta MacMBLIN II,Kynsley Whitehouse E 04/02/2015, 2:13 PM

## 2015-04-02 NOTE — Progress Notes (Signed)
Amniosure negative U/S: vtx, AFI  =  11.7 (24%)  Patient denies leaking/bleeding  A/P: Placenta Previa         Possible PROM based on positive amniosure x 1 with normal and stable AFI and no further C/O leaking/bleeding         D/C home-D/W patient PR, MBR, no heavy lifting         FU office 4 days as scheduled         All questions answered. Patient states she understands and agrees

## 2015-04-02 NOTE — Discharge Instructions (Signed)
No vaginal entry No sexual activity No heavy liftingFetal Movement Counts Patient Name: __________________________________________________ Patient Due Date: ____________________ Performing a fetal movement count is highly recommended in high-risk pregnancies, but it is good for every pregnant woman to do. Your health care provider may ask you to start counting fetal movements at 28 weeks of the pregnancy. Fetal movements often increase:  After eating a full meal.  After physical activity.  After eating or drinking something sweet or cold.  At rest. Pay attention to when you feel the baby is most active. This will help you notice a pattern of your baby's sleep and wake cycles and what factors contribute to an increase in fetal movement. It is important to perform a fetal movement count at the same time each day when your baby is normally most active.  HOW TO COUNT FETAL MOVEMENTS 1. Find a quiet and comfortable area to sit or lie down on your left side. Lying on your left side provides the best blood and oxygen circulation to your baby. 2. Write down the day and time on a sheet of paper or in a journal. 3. Start counting kicks, flutters, swishes, rolls, or jabs in a 2-hour period. You should feel at least 10 movements within 2 hours. 4. If you do not feel 10 movements in 2 hours, wait 2-3 hours and count again. Look for a change in the pattern or not enough counts in 2 hours. SEEK MEDICAL CARE IF:  You feel less than 10 counts in 2 hours, tried twice.  There is no movement in over an hour.  The pattern is changing or taking longer each day to reach 10 counts in 2 hours.  You feel the baby is not moving as he or she usually does. Date: ____________ Movements: ____________ Start time: ____________ Doreatha Martin time: ____________  Date: ____________ Movements: ____________ Start time: ____________ Doreatha Martin time: ____________ Date: ____________ Movements: ____________ Start time: ____________ Doreatha Martin  time: ____________ Date: ____________ Movements: ____________ Start time: ____________ Doreatha Martin time: ____________ Date: ____________ Movements: ____________ Start time: ____________ Doreatha Martin time: ____________ Date: ____________ Movements: ____________ Start time: ____________ Doreatha Martin time: ____________ Date: ____________ Movements: ____________ Start time: ____________ Doreatha Martin time: ____________ Date: ____________ Movements: ____________ Start time: ____________ Doreatha Martin time: ____________  Date: ____________ Movements: ____________ Start time: ____________ Doreatha Martin time: ____________ Date: ____________ Movements: ____________ Start time: ____________ Doreatha Martin time: ____________ Date: ____________ Movements: ____________ Start time: ____________ Doreatha Martin time: ____________ Date: ____________ Movements: ____________ Start time: ____________ Doreatha Martin time: ____________ Date: ____________ Movements: ____________ Start time: ____________ Doreatha Martin time: ____________ Date: ____________ Movements: ____________ Start time: ____________ Doreatha Martin time: ____________ Date: ____________ Movements: ____________ Start time: ____________ Doreatha Martin time: ____________  Date: ____________ Movements: ____________ Start time: ____________ Doreatha Martin time: ____________ Date: ____________ Movements: ____________ Start time: ____________ Doreatha Martin time: ____________ Date: ____________ Movements: ____________ Start time: ____________ Doreatha Martin time: ____________ Date: ____________ Movements: ____________ Start time: ____________ Doreatha Martin time: ____________ Date: ____________ Movements: ____________ Start time: ____________ Doreatha Martin time: ____________ Date: ____________ Movements: ____________ Start time: ____________ Doreatha Martin time: ____________ Date: ____________ Movements: ____________ Start time: ____________ Doreatha Martin time: ____________  Date: ____________ Movements: ____________ Start time: ____________ Doreatha Martin time: ____________ Date: ____________  Movements: ____________ Start time: ____________ Doreatha Martin time: ____________ Date: ____________ Movements: ____________ Start time: ____________ Doreatha Martin time: ____________ Date: ____________ Movements: ____________ Start time: ____________ Doreatha Martin time: ____________ Date: ____________ Movements: ____________ Start time: ____________ Doreatha Martin time: ____________ Date: ____________ Movements: ____________ Start time: ____________ Doreatha Martin time: ____________ Date: ____________ Movements: ____________ Start time: ____________ Doreatha Martin time: ____________  Date: ____________ Movements: ____________ Start time: ____________ Doreatha MartinFinish time: ____________ Date: ____________ Movements: ____________ Start time: ____________ Doreatha MartinFinish time: ____________ Date: ____________ Movements: ____________ Start time: ____________ Doreatha MartinFinish time: ____________ Date: ____________ Movements: ____________ Start time: ____________ Doreatha MartinFinish time: ____________ Date: ____________ Movements: ____________ Start time: ____________ Doreatha MartinFinish time: ____________ Date: ____________ Movements: ____________ Start time: ____________ Doreatha MartinFinish time: ____________ Date: ____________ Movements: ____________ Start time: ____________ Doreatha MartinFinish time: ____________  Date: ____________ Movements: ____________ Start time: ____________ Doreatha MartinFinish time: ____________ Date: ____________ Movements: ____________ Start time: ____________ Doreatha MartinFinish time: ____________ Date: ____________ Movements: ____________ Start time: ____________ Doreatha MartinFinish time: ____________ Date: ____________ Movements: ____________ Start time: ____________ Doreatha MartinFinish time: ____________ Date: ____________ Movements: ____________ Start time: ____________ Doreatha MartinFinish time: ____________ Date: ____________ Movements: ____________ Start time: ____________ Doreatha MartinFinish time: ____________ Date: ____________ Movements: ____________ Start time: ____________ Doreatha MartinFinish time: ____________  Date: ____________ Movements: ____________ Start time:  ____________ Doreatha MartinFinish time: ____________ Date: ____________ Movements: ____________ Start time: ____________ Doreatha MartinFinish time: ____________ Date: ____________ Movements: ____________ Start time: ____________ Doreatha MartinFinish time: ____________ Date: ____________ Movements: ____________ Start time: ____________ Doreatha MartinFinish time: ____________ Date: ____________ Movements: ____________ Start time: ____________ Doreatha MartinFinish time: ____________ Date: ____________ Movements: ____________ Start time: ____________ Doreatha MartinFinish time: ____________ Date: ____________ Movements: ____________ Start time: ____________ Doreatha MartinFinish time: ____________  Date: ____________ Movements: ____________ Start time: ____________ Doreatha MartinFinish time: ____________ Date: ____________ Movements: ____________ Start time: ____________ Doreatha MartinFinish time: ____________ Date: ____________ Movements: ____________ Start time: ____________ Doreatha MartinFinish time: ____________ Date: ____________ Movements: ____________ Start time: ____________ Doreatha MartinFinish time: ____________ Date: ____________ Movements: ____________ Start time: ____________ Doreatha MartinFinish time: ____________ Date: ____________ Movements: ____________ Start time: ____________ Doreatha MartinFinish time: ____________   This information is not intended to replace advice given to you by your health care provider. Make sure you discuss any questions you have with your health care provider.   Document Released: 05/07/2006 Document Revised: 04/28/2014 Document Reviewed: 02/02/2012 Elsevier Interactive Patient Education 2016 [employer] Placenta Previa Placenta previa is a condition in pregnant women where the placenta implants in the lower part of the uterus. The placenta either partially or completely covers the opening to the cervix. This is a problem because the baby must pass through the cervix during delivery. There are three types of placenta previa. They include:   Marginal placenta previa. The placenta is near the cervix, but does not cover the  opening.  Partial placenta previa. The placenta covers part of the cervical opening.  Complete placenta previa. The placenta covers the entire cervical opening.  Depending on the type of placenta previa, there is a chance the placenta may move into a normal position and no longer cover the cervix as the pregnancy progresses. It is important to keep all prenatal visits with your caregiver.  RISK FACTORS You may be more likely to develop placenta previa if you:  5. Are carrying more than one baby (multiples).  6. Have an abnormally shaped uterus.  7. Have scars on the lining of the uterus.  8. Had previous surgeries involving the uterus, such as a cesarean delivery.  9. Have delivered a baby previously.  10. Have a history of placenta previa.  11. Have smoked or used cocaine during pregnancy.  12. Are age 33 or older during pregnancy.  SYMPTOMS The main symptom of placenta previa is sudden, painless vaginal bleeding during the second half of pregnancy. The amount of bleeding can be light to very heavy. The bleeding may stop on its own, but almost always returns. Cramping, regular contractions, abdominal pain, and lower back pain can also occur with  placenta previa.  DIAGNOSIS Placenta previa can be diagnosed through an ultrasound by finding where the placenta is located. The ultrasound may find placenta previa either during a routine prenatal visit or after vaginal bleeding is noticed. If you are diagnosed with placenta previa, your caregiver may avoid vaginal exams to reduce the risk of heavy bleeding. There is a chance that placenta previa may not be diagnosed until bleeding occurs during labor.  TREATMENT Specific treatment depends on:   How much you are bleeding or if the bleeding has stopped.  How far along you are in your pregnancy.   The condition of the baby.   The location of the baby and placenta.   The type of placenta previa.  Depending on the factors above,  your caregiver may recommend:   Decreased activity.   Bed rest at home or in the hospital.  Pelvic rest. This means no sex, using tampons, douching, pelvic exams, or placing anything into the vagina.  A blood transfusion to replace maternal blood loss.  A cesarean delivery if the bleeding is heavy and cannot be controlled or the placenta completely covers the cervix.  Medication to stop premature labor or mature the fetal lungs if delivery is needed before the pregnancy is full term.  WHEN SHOULD YOU SEEK IMMEDIATE MEDICAL CARE IF YOU ARE SENT HOME WITH PLACENTA PREVIA? Seek immediate medical care if you show any symptoms of placenta previa. You will need to go to the hospital to get checked immediately. Again, those symptoms are:  Sudden, painless vaginal bleeding, even a small amount.  Cramping or regular contractions.  Lower back or abdominal pain.   This information is not intended to replace advice given to you by your health care provider. Make sure you discuss any questions you have with your health care provider.   Document Released: 04/07/2005 Document Revised: 04/28/2014 Document Reviewed: 07/09/2012 Elsevier Interactive Patient Education [employer]

## 2015-04-02 NOTE — Progress Notes (Signed)
26 2/7 wks Some mucous D/C a few days ago-none in the last couple days No gush of fluid, no bleeding, no pain  Good FM  VSS Afeb Uterus soft, NT  Speculum exam-pediatric speculum in lower half of vagina                             Normal D/C of pregnancy                             amniosure done  A/P: Placenta Previa         PPROM by + amniosure         D/W patient and husband-will check amniosure resuts, repeat U/S for AFI today and reevaluate                 D/W possible D/C home with MBR and PR         All questions answered

## 2015-04-28 ENCOUNTER — Inpatient Hospital Stay (HOSPITAL_COMMUNITY)
Admission: AD | Admit: 2015-04-28 | Discharge: 2015-05-02 | DRG: 766 | Disposition: A | Discharge status: Home / Self Care | Payer: BLUE CROSS/BLUE SHIELD | Source: Ambulatory Visit | Attending: Obstetrics and Gynecology | Admitting: Obstetrics and Gynecology

## 2015-04-28 ENCOUNTER — Encounter (HOSPITAL_COMMUNITY): Payer: Self-pay | Admitting: *Deleted

## 2015-04-28 DIAGNOSIS — O4413 Placenta previa with hemorrhage, third trimester: Principal | ICD-10-CM | POA: Diagnosis present

## 2015-04-28 DIAGNOSIS — O4593 Premature separation of placenta, unspecified, third trimester: Secondary | ICD-10-CM | POA: Diagnosis present

## 2015-04-28 DIAGNOSIS — Z3A30 30 weeks gestation of pregnancy: Secondary | ICD-10-CM | POA: Diagnosis not present

## 2015-04-28 DIAGNOSIS — O4690 Antepartum hemorrhage, unspecified, unspecified trimester: Secondary | ICD-10-CM | POA: Diagnosis present

## 2015-04-28 DIAGNOSIS — Z87891 Personal history of nicotine dependence: Secondary | ICD-10-CM

## 2015-04-28 DIAGNOSIS — Z3483 Encounter for supervision of other normal pregnancy, third trimester: Secondary | ICD-10-CM | POA: Diagnosis present

## 2015-04-28 LAB — URINALYSIS, ROUTINE W REFLEX MICROSCOPIC
Bilirubin Urine: NEGATIVE
Glucose, UA: NEGATIVE mg/dL
Ketones, ur: NEGATIVE mg/dL
Leukocytes, UA: NEGATIVE
Nitrite: NEGATIVE
Protein, ur: NEGATIVE mg/dL
Specific Gravity, Urine: <1.005 — ABNORMAL LOW (ref 1.005–1.030)
pH: 6 (ref 5.0–8.0)

## 2015-04-28 LAB — CBC
HCT: 34.5 % — ABNORMAL LOW (ref 36.0–46.0)
Hemoglobin: 12.2 g/dL (ref 12.0–15.0)
MCH: 32.2 pg (ref 26.0–34.0)
MCHC: 35.4 g/dL (ref 30.0–36.0)
MCV: 91 fL (ref 78.0–100.0)
Platelets: 212 10*3/uL (ref 150–400)
RBC: 3.79 MIL/uL — ABNORMAL LOW (ref 3.87–5.11)
RDW: 12.8 % (ref 11.5–15.5)
WBC: 13.6 10*3/uL — ABNORMAL HIGH (ref 4.0–10.5)

## 2015-04-28 LAB — URINE MICROSCOPIC-ADD ON

## 2015-04-28 LAB — TYPE AND SCREEN
ABO/RH(D): A POS
Antibody Screen: NEGATIVE

## 2015-04-28 MED ORDER — PRENATAL MULTIVITAMIN CH
1.0000 | ORAL_TABLET | Freq: Every day | ORAL | Status: DC
  Filled 2015-04-28: qty 1

## 2015-04-28 MED ORDER — LACTATED RINGERS IV SOLN
INTRAVENOUS | Status: DC
  Administered 2015-04-28 – 2015-04-29 (×2): via INTRAVENOUS

## 2015-04-28 MED ORDER — ZOLPIDEM TARTRATE 5 MG PO TABS: 5.0000 mg | ORAL_TABLET | Freq: Every evening | ORAL | Status: DC | PRN

## 2015-04-28 MED ORDER — DOCUSATE SODIUM 100 MG PO CAPS
100.0000 mg | ORAL_CAPSULE | Freq: Every day | ORAL | Status: DC
  Filled 2015-04-28: qty 1

## 2015-04-28 MED ORDER — BETAMETHASONE SOD PHOS & ACET 6 (3-3) MG/ML IJ SUSP
12.0000 mg | Freq: Once | INTRAMUSCULAR | Status: AC
  Administered 2015-04-28: 12 mg via INTRAMUSCULAR
  Filled 2015-04-28: qty 2

## 2015-04-28 MED ORDER — ACETAMINOPHEN 325 MG PO TABS: 650.0000 mg | ORAL_TABLET | ORAL | Status: DC | PRN

## 2015-04-28 MED ORDER — MAGNESIUM SULFATE BOLUS VIA INFUSION
4.0000 g | Freq: Once | INTRAVENOUS | Status: AC
  Administered 2015-04-28: 4 g via INTRAVENOUS
  Filled 2015-04-28: qty 500

## 2015-04-28 MED ORDER — MAGNESIUM SULFATE BOLUS VIA INFUSION
4.0000 g | Freq: Once | INTRAVENOUS | Status: DC
Start: 2015-04-28 — End: 2015-04-28
  Filled 2015-04-28: qty 500

## 2015-04-28 MED ORDER — MAGNESIUM SULFATE 50 % IJ SOLN
2.0000 g/h | INTRAVENOUS | Status: DC
  Administered 2015-04-28: 2 g/h via INTRAVENOUS
  Filled 2015-04-28: qty 80

## 2015-04-28 MED ORDER — CALCIUM CARBONATE ANTACID 500 MG PO CHEW
2.0000 | CHEWABLE_TABLET | ORAL | Status: DC | PRN
  Filled 2015-04-28: qty 2

## 2015-04-28 NOTE — MAU Provider Note (Signed)
  History     CSN: [id number]  Arrival date and time: 04/28/15 1815   First Provider Initiated Contact with Patient 04/28/15 1857      Chief Complaint  Patient presents with  . Vaginal Bleeding   HPI Colleen HooverArielle Dalton 35 y.o. [id number]   Client had an episode of vaginal bleeding while sitting on the toilet.  She had sneezed and then noted bleeding in the toilet.  Had not been feeling cramping earlier today.  Has noted some cramping since the bleeding started.  History of previous admission when she had some leaking and was evaluated for ROM.  Had course of betamethasone on 03-27-15.  OB History    Gravida Para Term Preterm AB TAB SAB Ectopic Multiple Living   3    2  2          Past Medical History  Diagnosis Date  . Vaginal Pap smear, abnormal   . Anxiety   . History of cryosurgery     Past Surgical History  Procedure Laterality Date  . Colposcopy    . Eye surgery      as child    Family History  Problem Relation Age of Onset  . Asthma Father   . Cancer Father     prostate and colon  . Deep vein thrombosis Maternal Aunt   . Cancer Maternal Aunt     x2 aunts thyroid cancer  . Alcohol abuse Paternal Aunt   . Alcohol abuse Paternal Uncle   . Asthma Maternal Grandmother   . Kidney disease Maternal Grandmother   . Heart disease Maternal Grandmother   . Diabetes Maternal Grandmother   . Kidney disease Maternal Grandfather     Social History  Substance Use Topics  . Smoking status: Former Smoker    Quit date: 04/23/1999  . Smokeless tobacco: None  . Alcohol Use: No    Allergies: No Known Allergies  Prescriptions prior to admission  Medication Sig Dispense Refill Last Dose  . amoxicillin (AMOXIL) 500 MG capsule Take 1 capsule (500 mg total) by mouth every 8 (eight) hours. 1 capsule 0   . Docosahexaenoic Acid (PRENATAL DHA PO) Take 1 tablet by mouth 2 (two) times daily.   03/26/2015 at Unknown time    Review of Systems  Constitutional: Negative for fever.   Gastrointestinal: Positive for abdominal pain. Negative for nausea and vomiting.  Genitourinary: Negative for dysuria.       Vaginal Bleeding  Neurological: Negative for headaches.   Physical Exam   Blood pressure 130/70, pulse 79, temperature 98.1 F (36.7 C), resp. rate 18, last menstrual period 09/30/2014.  Physical Exam  Nursing note and vitals reviewed. Constitutional: She is oriented to person, place, and time. She appears well-developed and well-nourished.  HENT:  Head: Normocephalic.  Eyes: EOM are normal.  Neck: Neck supple.  GI:  On monitor - no contractions printing on strip.  Able to palpate some uterine irritability and client reports uterine tightening.  Musculoskeletal: Normal range of motion.  Neurological: She is alert and oriented to person, place, and time.  Skin: Skin is warm and dry.  Psychiatric: She has a normal mood and affect.    MAU Course  Procedures  MDM Consult with Dr. Henderson Cloudomblin for plan of care  Assessment and Plan  Admit to antenatal  - orders entered. Dr. Henderson Cloudomblin to come in later tonight to see patient.  [name] 04/28/2015, 7:25 PM

## 2015-04-28 NOTE — MAU Note (Signed)
Pt presents to MAU with complaints of vaginal bleeding 20 minutes ago. Pt has a known complete previa. Denies any pain at present. Scant amount of blood noted on peri pad

## 2015-04-28 NOTE — H&P (Signed)
Colleen Dalton is a 35 y.o. female presenting for C/O BRB on toilet after a hard sneeze. Otherwise has been feeling fine without any complaint prior to this episode. Since this episode has mild tightening across lower abdomen. No trauma. No further bleeding. Known anterior placenta previa. Was admitted 1 month ago with a question of PROM based on positive Amniosure and mucous discharge. No gushes of fluid then or now. AFI at that admission remained stable within normal limits. Patient reports U/S in office 1 week after discharge that again noted AFI stable. Maternal Medical History:  Reason for admission: Vaginal bleeding.   Fetal activity: Perceived fetal activity is normal.      OB History    Gravida Para Term Preterm AB TAB SAB Ectopic Multiple Living   3    2  2         Past Medical History  Diagnosis Date  . Vaginal Pap smear, abnormal   . Anxiety   . History of cryosurgery    Past Surgical History  Procedure Laterality Date  . Colposcopy    . Eye surgery      as child   Family History: family history includes Alcohol abuse in her paternal aunt and paternal uncle; Asthma in her father and maternal grandmother; Cancer in her father and maternal aunt; Deep vein thrombosis in her maternal aunt; Diabetes in her maternal grandmother; Heart disease in her maternal grandmother; Kidney disease in her maternal grandfather and maternal grandmother. Social History:  reports that she quit smoking about 16 years ago. She does not have any smokeless tobacco history on file. She reports that she does not drink alcohol or use illicit drugs.   Prenatal Transfer Tool  Maternal Diabetes: No Genetic Screening: Normal Maternal Ultrasounds/Referrals: Abnormal:  Findings:   Other: Fetal Ultrasounds or other Referrals:  None Maternal Substance Abuse:  No Significant Maternal Medications:  None Significant Maternal Lab Results:  None Other Comments:  None  Review of Systems  Eyes: Negative for  blurred vision.  Neurological: Negative for headaches.      Blood pressure 116/71, pulse 69, temperature 98.4 F (36.9 C), temperature source Oral, resp. rate 16, height 5\' 4"  (1.626 m), weight 171 lb (77.565 kg), last menstrual period 09/30/2014. Maternal Exam:  Uterine Assessment: Contraction strength is mild.  Contraction frequency is irregular.   Abdomen: Patient reports no abdominal tenderness.   Fetal Exam Fetal State Assessment: Category I - tracings are normal.     Physical Exam  Cardiovascular: Normal rate and regular rhythm.   Respiratory: Effort normal and breath sounds normal.  GI: Soft. There is no tenderness.  Genitourinary:  Uterus soft , NT    UCs palpated in MAU although not all recording on EFM  Prenatal labs: ABO, Rh: --/--/A POS ([birth year]/07 1944) Antibody: NEG (01/07 1945) Rubella:   RPR:    HBsAg:    HIV:    GBS:     Assessment/Plan: 35 yo G3P0 @ 30 0/7 weeks with anterior placenta previa and bleeding, currently stable PT contractions Admit to antenatal, IV fluids, magnesium sulfate for tocolysis, single dose BMTZ (S/P BMTZ series @ about 26 weeks), T&S, clear liquids, EFM Will continue magnesium sulfate for at least 24 hours if remains stable D/W patient and husband above-risk of life threatening bleeding for patient and baby reviewed. Possible emergent cesarean section reviewed with risks including infection, organ damage, bleeding/transfusion-HIV/Hep, DVT/PE, pneumonia, hysterectomy.  All questions answered. Patient and husband state they understand and agree. Lowen Mansouri II,Shakura Cowing E 04/28/2015,  9:27 PM

## 2015-04-29 ENCOUNTER — Encounter (HOSPITAL_COMMUNITY): Payer: Self-pay

## 2015-04-29 ENCOUNTER — Inpatient Hospital Stay (HOSPITAL_COMMUNITY): Payer: BLUE CROSS/BLUE SHIELD | Admitting: Anesthesiology

## 2015-04-29 ENCOUNTER — Encounter (HOSPITAL_COMMUNITY)
Admission: AD | Disposition: A | Discharge status: Home / Self Care | Payer: Self-pay | Source: Ambulatory Visit | Attending: Obstetrics and Gynecology

## 2015-04-29 SURGERY — Surgical Case
Anesthesia: Spinal

## 2015-04-29 MED ORDER — CEFAZOLIN SODIUM-DEXTROSE 2-3 GM-% IV SOLR
INTRAVENOUS | Status: DC | PRN
  Administered 2015-04-29: 2 g via INTRAVENOUS

## 2015-04-29 MED ORDER — MENTHOL 3 MG MT LOZG: 1.0000 | LOZENGE | OROMUCOSAL | Status: DC | PRN

## 2015-04-29 MED ORDER — CEFAZOLIN SODIUM 1-5 GM-% IV SOLN
1.0000 g | Freq: Three times a day (TID) | INTRAVENOUS | Status: DC
  Filled 2015-04-29: qty 50

## 2015-04-29 MED ORDER — WITCH HAZEL-GLYCERIN EX PADS: 1.0000 "application " | MEDICATED_PAD | CUTANEOUS | Status: DC | PRN

## 2015-04-29 MED ORDER — HYDROMORPHONE HCL 1 MG/ML IJ SOLN
0.2500 mg | INTRAMUSCULAR | Status: DC | PRN
  Administered 2015-04-29: 0.5 mg via INTRAVENOUS

## 2015-04-29 MED ORDER — MORPHINE SULFATE (PF) 0.5 MG/ML IJ SOLN
INTRAMUSCULAR | Status: DC | PRN
  Administered 2015-04-29: .1 mg via INTRATHECAL

## 2015-04-29 MED ORDER — SCOPOLAMINE 1 MG/3DAYS TD PT72
MEDICATED_PATCH | TRANSDERMAL | Status: DC | PRN
  Administered 2015-04-29: 1 via TRANSDERMAL

## 2015-04-29 MED ORDER — ACETAMINOPHEN 325 MG PO TABS: 650.0000 mg | ORAL_TABLET | ORAL | Status: DC | PRN

## 2015-04-29 MED ORDER — LACTATED RINGERS IV SOLN
INTRAVENOUS | Status: DC | PRN
  Administered 2015-04-29: 05:00:00 via INTRAVENOUS

## 2015-04-29 MED ORDER — CEFAZOLIN SODIUM-DEXTROSE 2-3 GM-% IV SOLR
INTRAVENOUS | Status: AC
  Filled 2015-04-29: qty 50

## 2015-04-29 MED ORDER — SIMETHICONE 80 MG PO CHEW
80.0000 mg | CHEWABLE_TABLET | ORAL | Status: DC
  Administered 2015-04-29 – 2015-05-01 (×3): 80 mg via ORAL
  Filled 2015-04-29 (×3): qty 1

## 2015-04-29 MED ORDER — PHENYLEPHRINE 8 MG IN D5W 100 ML (0.08MG/ML) PREMIX OPTIME
INJECTION | INTRAVENOUS | Status: DC | PRN
  Administered 2015-04-29: 80 ug/min via INTRAVENOUS

## 2015-04-29 MED ORDER — NALOXONE HCL 2 MG/2ML IJ SOSY
1.0000 ug/kg/h | PREFILLED_SYRINGE | INTRAVENOUS | Status: DC | PRN
  Filled 2015-04-29: qty 2

## 2015-04-29 MED ORDER — SCOPOLAMINE 1 MG/3DAYS TD PT72
MEDICATED_PATCH | TRANSDERMAL | Status: AC
  Filled 2015-04-29: qty 1

## 2015-04-29 MED ORDER — OXYTOCIN 10 UNIT/ML IJ SOLN
INTRAMUSCULAR | Status: AC
  Filled 2015-04-29: qty 4

## 2015-04-29 MED ORDER — NALBUPHINE HCL 10 MG/ML IJ SOLN: 5.0000 mg | Freq: Once | INTRAMUSCULAR | Status: DC | PRN

## 2015-04-29 MED ORDER — FENTANYL CITRATE (PF) 250 MCG/5ML IJ SOLN
INTRAMUSCULAR | Status: AC
  Filled 2015-04-29: qty 5

## 2015-04-29 MED ORDER — NALOXONE HCL 0.4 MG/ML IJ SOLN: 0.4000 mg | INTRAMUSCULAR | Status: DC | PRN

## 2015-04-29 MED ORDER — PRENATAL MULTIVITAMIN CH
1.0000 | ORAL_TABLET | Freq: Every day | ORAL | Status: DC
  Filled 2015-04-29 (×2): qty 1

## 2015-04-29 MED ORDER — DIBUCAINE 1 % RE OINT: 1.0000 "application " | TOPICAL_OINTMENT | RECTAL | Status: DC | PRN

## 2015-04-29 MED ORDER — ACETAMINOPHEN 500 MG PO TABS
1000.0000 mg | ORAL_TABLET | Freq: Four times a day (QID) | ORAL | Status: AC
  Administered 2015-04-29: 1000 mg via ORAL
  Filled 2015-04-29: qty 2

## 2015-04-29 MED ORDER — OXYTOCIN 10 UNIT/ML IJ SOLN
40.0000 [IU] | INTRAMUSCULAR | Status: DC | PRN
  Administered 2015-04-29: 40 [IU] via INTRAVENOUS

## 2015-04-29 MED ORDER — NALBUPHINE HCL 10 MG/ML IJ SOLN
5.0000 mg | INTRAMUSCULAR | Status: DC | PRN
  Administered 2015-04-29: 5 mg via INTRAVENOUS
  Filled 2015-04-29: qty 1

## 2015-04-29 MED ORDER — ONDANSETRON HCL 4 MG/2ML IJ SOLN: 4.0000 mg | Freq: Three times a day (TID) | INTRAMUSCULAR | Status: DC | PRN

## 2015-04-29 MED ORDER — SIMETHICONE 80 MG PO CHEW
80.0000 mg | CHEWABLE_TABLET | Freq: Three times a day (TID) | ORAL | Status: DC
  Administered 2015-04-29 – 2015-05-01 (×8): 80 mg via ORAL
  Filled 2015-04-29 (×8): qty 1

## 2015-04-29 MED ORDER — IBUPROFEN 600 MG PO TABS
600.0000 mg | ORAL_TABLET | Freq: Four times a day (QID) | ORAL | Status: DC
  Administered 2015-04-29 – 2015-05-01 (×10): 600 mg via ORAL
  Filled 2015-04-29 (×11): qty 1

## 2015-04-29 MED ORDER — ONDANSETRON HCL 4 MG/2ML IJ SOLN
INTRAMUSCULAR | Status: AC
  Filled 2015-04-29: qty 2

## 2015-04-29 MED ORDER — MEPERIDINE HCL 25 MG/ML IJ SOLN
INTRAMUSCULAR | Status: AC
  Filled 2015-04-29: qty 1

## 2015-04-29 MED ORDER — SODIUM CHLORIDE 0.9 % IJ SOLN: 3.0000 mL | INTRAMUSCULAR | Status: DC | PRN

## 2015-04-29 MED ORDER — DIPHENHYDRAMINE HCL 25 MG PO CAPS: 25.0000 mg | ORAL_CAPSULE | Freq: Four times a day (QID) | ORAL | Status: DC | PRN

## 2015-04-29 MED ORDER — NALBUPHINE HCL 10 MG/ML IJ SOLN: 5.0000 mg | INTRAMUSCULAR | Status: DC | PRN

## 2015-04-29 MED ORDER — DIPHENHYDRAMINE HCL 25 MG PO CAPS
25.0000 mg | ORAL_CAPSULE | ORAL | Status: DC | PRN
  Filled 2015-04-29: qty 1

## 2015-04-29 MED ORDER — KETOROLAC TROMETHAMINE 30 MG/ML IJ SOLN
INTRAMUSCULAR | Status: AC
  Filled 2015-04-29: qty 1

## 2015-04-29 MED ORDER — ACETAMINOPHEN 160 MG/5ML PO SOLN: 975.0000 mg | Freq: Once | ORAL | Status: DC

## 2015-04-29 MED ORDER — SCOPOLAMINE 1 MG/3DAYS TD PT72: 1.0000 | MEDICATED_PATCH | Freq: Once | TRANSDERMAL | Status: DC

## 2015-04-29 MED ORDER — MEPERIDINE HCL 25 MG/ML IJ SOLN
INTRAMUSCULAR | Status: DC | PRN
  Administered 2015-04-29 (×2): 12.5 mg via INTRAVENOUS

## 2015-04-29 MED ORDER — LACTATED RINGERS IV SOLN: 2.5000 [IU]/h | INTRAVENOUS | Status: AC

## 2015-04-29 MED ORDER — TETANUS-DIPHTH-ACELL PERTUSSIS 5-2.5-18.5 LF-MCG/0.5 IM SUSP: 0.5000 mL | Freq: Once | INTRAMUSCULAR | Status: DC

## 2015-04-29 MED ORDER — FENTANYL CITRATE (PF) 100 MCG/2ML IJ SOLN
INTRAMUSCULAR | Status: AC
  Filled 2015-04-29: qty 2

## 2015-04-29 MED ORDER — ONDANSETRON HCL 4 MG/2ML IJ SOLN
INTRAMUSCULAR | Status: DC | PRN
  Administered 2015-04-29: 4 mg via INTRAVENOUS

## 2015-04-29 MED ORDER — DIPHENHYDRAMINE HCL 50 MG/ML IJ SOLN: 12.5000 mg | INTRAMUSCULAR | Status: DC | PRN

## 2015-04-29 MED ORDER — CITRIC ACID-SODIUM CITRATE 334-500 MG/5ML PO SOLN
ORAL | Status: AC
  Administered 2015-04-29: 05:00:00
  Filled 2015-04-29: qty 15

## 2015-04-29 MED ORDER — MORPHINE SULFATE (PF) 0.5 MG/ML IJ SOLN
INTRAMUSCULAR | Status: AC
  Filled 2015-04-29: qty 10

## 2015-04-29 MED ORDER — SIMETHICONE 80 MG PO CHEW: 80.0000 mg | CHEWABLE_TABLET | ORAL | Status: DC | PRN

## 2015-04-29 MED ORDER — FENTANYL CITRATE (PF) 100 MCG/2ML IJ SOLN
INTRAMUSCULAR | Status: DC | PRN
  Administered 2015-04-29 (×2): 50 ug via INTRAVENOUS

## 2015-04-29 MED ORDER — KETOROLAC TROMETHAMINE 30 MG/ML IJ SOLN
30.0000 mg | Freq: Once | INTRAMUSCULAR | Status: AC
  Administered 2015-04-29: 30 mg via INTRAVENOUS

## 2015-04-29 MED ORDER — ZOLPIDEM TARTRATE 5 MG PO TABS: 5.0000 mg | ORAL_TABLET | Freq: Every evening | ORAL | Status: DC | PRN

## 2015-04-29 MED ORDER — LACTATED RINGERS IV SOLN
INTRAVENOUS | Status: DC
  Administered 2015-04-29: 16:00:00 via INTRAVENOUS

## 2015-04-29 MED ORDER — SENNOSIDES-DOCUSATE SODIUM 8.6-50 MG PO TABS
2.0000 | ORAL_TABLET | ORAL | Status: DC
  Administered 2015-04-29 – 2015-05-01 (×3): 2 via ORAL
  Filled 2015-04-29 (×3): qty 2

## 2015-04-29 MED ORDER — DEXAMETHASONE SODIUM PHOSPHATE 4 MG/ML IJ SOLN
INTRAMUSCULAR | Status: DC | PRN
  Administered 2015-04-29: 4 mg via INTRAVENOUS

## 2015-04-29 MED ORDER — LANOLIN HYDROUS EX OINT
1.0000 | TOPICAL_OINTMENT | CUTANEOUS | Status: DC | PRN
Start: 2015-04-29 — End: 2015-05-02

## 2015-04-29 MED ORDER — HYDROMORPHONE HCL 1 MG/ML IJ SOLN
INTRAMUSCULAR | Status: AC
  Filled 2015-04-29: qty 1

## 2015-04-29 MED ORDER — MEPERIDINE HCL 25 MG/ML IJ SOLN: 6.2500 mg | INTRAMUSCULAR | Status: DC | PRN

## 2015-04-29 SURGICAL SUPPLY — 37 items
APL SKNCLS STERI-STRIP NONHPOA (GAUZE/BANDAGES/DRESSINGS) ×1
BENZOIN TINCTURE PRP APPL 2/3 (GAUZE/BANDAGES/DRESSINGS) ×1 IMPLANT
CLAMP CORD UMBIL (MISCELLANEOUS) IMPLANT
CLOSURE STERI STRIP 1/2 X4 (GAUZE/BANDAGES/DRESSINGS) ×1 IMPLANT
CLOTH BEACON ORANGE TIMEOUT ST (SAFETY) ×2 IMPLANT
CONTAINER PREFILL 10% NBF 15ML (MISCELLANEOUS) IMPLANT
DRAPE SHEET LG 3/4 BI-LAMINATE (DRAPES) IMPLANT
DRSG OPSITE POSTOP 4X10 (GAUZE/BANDAGES/DRESSINGS) ×2 IMPLANT
DURAPREP 26ML APPLICATOR (WOUND CARE) ×2 IMPLANT
ELECT REM PT RETURN 9FT ADLT (ELECTROSURGICAL) ×2
ELECTRODE REM PT RTRN 9FT ADLT (ELECTROSURGICAL) ×1 IMPLANT
EXTRACTOR VACUUM M CUP 4 TUBE (SUCTIONS) IMPLANT
GLOVE BIO SURGEON STRL SZ8 (GLOVE) ×2 IMPLANT
GLOVE BIOGEL PI IND STRL 7.0 (GLOVE) ×1 IMPLANT
GLOVE BIOGEL PI INDICATOR 7.0 (GLOVE) ×1
GOWN STRL REUS W/TWL LRG LVL3 (GOWN DISPOSABLE) ×4 IMPLANT
KIT ABG SYR 3ML LUER SLIP (SYRINGE) ×2 IMPLANT
LIQUID BAND (GAUZE/BANDAGES/DRESSINGS) IMPLANT
NDL HYPO 25X5/8 SAFETYGLIDE (NEEDLE) ×1 IMPLANT
NEEDLE HYPO 25X5/8 SAFETYGLIDE (NEEDLE) ×2 IMPLANT
NS IRRIG 1000ML POUR BTL (IV SOLUTION) ×2 IMPLANT
PACK C SECTION WH (CUSTOM PROCEDURE TRAY) ×2 IMPLANT
PAD ABD 8X10 STRL (GAUZE/BANDAGES/DRESSINGS) ×1 IMPLANT
PAD OB MATERNITY 4.3X12.25 (PERSONAL CARE ITEMS) ×2 IMPLANT
PENCIL SMOKE EVAC W/HOLSTER (ELECTROSURGICAL) ×2 IMPLANT
SPONGE GAUZE 4X4 12PLY STER LF (GAUZE/BANDAGES/DRESSINGS) ×1 IMPLANT
STRIP CLOSURE SKIN 1/2X4 (GAUZE/BANDAGES/DRESSINGS) IMPLANT
SUT MNCRL 0 VIOLET CTX 36 (SUTURE) ×4 IMPLANT
SUT MONOCRYL 0 CTX 36 (SUTURE) ×4
SUT PDS AB 0 CTX 60 (SUTURE) ×2 IMPLANT
SUT PLAIN 0 NONE (SUTURE) IMPLANT
SUT PLAIN 2 0 (SUTURE)
SUT PLAIN 2 0 XLH (SUTURE) IMPLANT
SUT PLAIN ABS 2-0 CT1 27XMFL (SUTURE) IMPLANT
SUT VIC AB 4-0 KS 27 (SUTURE) ×2 IMPLANT
TOWEL OR 17X24 6PK STRL BLUE (TOWEL DISPOSABLE) ×2 IMPLANT
TRAY FOLEY CATH SILVER 14FR (SET/KITS/TRAYS/PACK) ×2 IMPLANT

## 2015-04-29 NOTE — Transfer of Care (Signed)
Immediate Anesthesia Transfer of Care Note  Patient: Colleen HooverArielle Thiemann  Procedure(s) Performed: Procedure(s): CESAREAN SECTION (N/A)  Patient Location: PACU  Anesthesia Type:Spinal  Level of Consciousness: awake, alert  and oriented  Airway & Oxygen Therapy: Patient Spontanous Breathing  Post-op Assessment: Report given to RN and Post -op Vital signs reviewed and stable  Post vital signs: Reviewed and stable  Last Vitals:  Filed Vitals:   04/29/15 0401 04/29/15 0501  BP: 107/54 109/59  Pulse: 70 69  Temp:    Resp: 16     Complications: No apparent anesthesia complications

## 2015-04-29 NOTE — Consult Note (Signed)
[facility]  Delivery Note:  C-section       04/29/2015  6:03 AM  I was called to the operating room at the request of the patient's obstetrician (Dr. Tomblin) for a Code c-section, called for increased bleeding in the setting of placenta previa.  PRENATAL HX:  This is a 34 y/o G3P0020 at 30 and 1/[redacted] weeks gestation whose pregnancy has been complicated by placenta previa.  She was admitted 1 month ago with question of PPROM based on positive amniosure and mucous discharge.  However, AFI has stayed stable.  She received a course of BMZ at that time (12/6 and 12/7).  She was readmitted last night for bleeding, which then worsened so infant was delivered by stat c-section.  She received a dose of BMZ and was started on magnesium prior to delivery.    DELIVERY:  Infant had poor cry and decreased tone at delivery, was apneic with HR < 100 so PPV given x30 seconds.  HR improved to 150s and infant began to breath spontaneously with a good cry.  APGARs 3 and 8.  Exam notable for small 1 cm shallow laceration along left scalp, mild retractions, and moderate tachypnea.  CPAP given in DR for low O2 saturations, but saturations quickly improved with CPAP and FiO2 rapidly weaned to 21%.  Will admit to NICU for prematurity.    _____________________ Electronically Signed By: Rasheed Welty, MD Neonatologist 

## 2015-04-29 NOTE — Lactation Note (Addendum)
This note was copied from the chart of Colleen Dalton. Lactation Consultation Note  Patient Name: Colleen Dalton Today's Date: 04/29/2015 Reason for consult: Initial assessment;NICU baby;Infant < 6lbs   Initial consult with mom of NICU baby born at 30 w 1 d gestation. Mom reports she has visited infant and held him STS. Mom has pumped x 2 using DEBP she received 3 ml and 12 ml which she has taken to NICU. Mom had colostrum collection containers, number labels, milk labels and bottles at bedside. Enc mom to pump every 2-3 hours for 15 minutes on Initiate setting. Mom still needs to be taught hand expression, she has a room full of visitors currently. Mom has hands free bra and is using. Mom has a PIS at home for pumping use. Advised mom to call with questions/concerns. Gave mom Providing Milk for your Baby in NICU Booklet and discussed milk storage for NICU baby. LC Brochure given, advised of IP/OP Services and LC phone #. Enc mom to call for assistance as needed.   Maternal Data Formula Feeding for Exclusion: No  Feeding    LATCH Score/Interventions                      Lactation Tools Discussed/Used Pump Review: Setup, frequency, and cleaning;Milk Storage   Consult Status Consult Status: Follow-up Date: 04/30/15 Follow-up type: In-patient    Egor Fullilove S Niyam Bisping 04/29/2015, 2:56 PM    

## 2015-04-29 NOTE — Brief Op Note (Signed)
04/28/2015 - 04/29/2015  6:13 AM  PATIENT:  Colleen HooverArielle Dalton  35 y.o. female  PRE-OPERATIVE DIAGNOSIS:  Placenta previa                                                      Abruption  POST-OPERATIVE DIAGNOSIS:  Placenta Previa                                                         Abruption  PROCEDURE:  Procedure(s): CESAREAN SECTION (N/A)  SURGEON:  Surgeon(s) and Role:    * Harold HedgeJames Dennard Vezina, MD - Primary  PHYSICIAN ASSISTANT:   ASSISTANTS: none   ANESTHESIA:   spinal  EBL:  Total I/O In: 2960 [P.O.:360; I.V.:2600] Out: 1725 [Urine:1225; Blood:500]  BLOOD ADMINISTERED:none  DRAINS: Urinary Catheter (Foley)   LOCAL MEDICATIONS USED:  NONE  SPECIMEN:  Source of Specimen:  placenta  DISPOSITION OF SPECIMEN:  PATHOLOGY  COUNTS:  YES  TOURNIQUET:  * No tourniquets in log *  DICTATION: .Other Dictation: Dictation Number [id number]  PLAN OF CARE: Admit to inpatient   PATIENT DISPOSITION:  PACU - hemodynamically stable.   Delay start of Pharmacological VTE agent (>24hrs) due to surgical blood loss or risk of bleeding: not applicable

## 2015-04-29 NOTE — Op Note (Signed)
NAME:  Colleen Dalton, Colleen Dalton                ACCOUNT NO.:  [id number]  MEDICAL RECORD NO.:  [id number]  LOCATION:  [facility]                          [facility]:  [facility]  PHYSICIAN:  Guy SandiferJames E. Henderson Cloudomblin, M.D. DATE OF BIRTH:  [birth year]  DATE OF PROCEDURE:  04/29/2015 DATE OF DISCHARGE:                              OPERATIVE REPORT   PREOPERATIVE DIAGNOSIS: 1. Intrauterine pregnancy at 30-1/7th weeks. 2. Placenta previa. 3. Placental abruption.  POSTOPERATIVE DIAGNOSIS: 1. Intrauterine pregnancy at 30-1/7th weeks. 2. Placenta previa. 3. Placental abruption.  PROCEDURE:  Emergent low-transverse cesarean section.  SURGEON:  Guy SandiferJames E. Henderson Cloudomblin, MD.  ANESTHESIA:  Spinal, Belva AgeeKyle E. Jackson, MD.  ESTIMATED BLOOD LOSS:  500 mL.  FINDINGS:  Viable infant.  Apgars, arterial cord, pH, birth weight pending.  SPECIMENS:  Placenta to Pathology.  INDICATIONS AND CONSENT:  This patient is a 35 year old patient at 7030- 1/7th weeks.  She has a known anterior placenta previa.  She is admitted on the evening of April 28, 2015, with a bright-red bleeding x1 after sneezing on the toilet.  At the time of admission, she is stable and not bleeding.  She had received betamethasone series approximately 4 weeks earlier.  She was given a booster dose on the evening of admission. This a.m., she awoke to a vaginal bleeding.  I was called and responded immediately.  I was in the hospital.  She had soaked 2 of the large perineal pads.  Upon examination in the room, the uterus was soft, but she had continued bright red vaginal bleeding.  Emergent cesarean section was advised.  The patient agrees.  PROCEDURE IN DETAIL:  The patient was taken to the operating room in an emergent fashion.  Upon arrival in the operating room, she is identified.  Fetal heart rate is 150 beats per minute.  She then has a spinal anesthetic placed per Dr. Cristela BlueKyle Jackson.  She was placed in a dorsal supine position with a 15-degree left lateral wedge.  I  placed her a Foley catheter.  Abdomen was prepped with Betadine poured over the abdomen.  She was then draped in a sterile fashion.  Time-out was undertaken prior to starting the case.  After testing for adequate spinal anesthesia, skin was entered through a Pfannenstiel incision. Dissection was rapidly carried out to the peritoneum, which was bluntly entered and extended bluntly as well.  The vesicouterine peritoneum was taken down cephalolaterally.  The uterus was then incised in a low transverse manner and the uterine cavity was entered bluntly with a hemostat.  The uterine incision was extended cephalolaterally.  The placenta is presenting through the incision.  I dissected upward over the top edge of the placenta, penetrated the membranes and the vertex. The baby was then delivered without difficulty.  Cord was clamped and cut.  The baby was handed to the waiting pediatrics team.  The uterus was then exteriorized.  The placenta was noted to be anterior and completely covered the internal cervical os.  Upon removal of the placenta, there was an obvious portion where there had been some abruption.  The placenta extracted easily and completely separate.  Care was taken to make sure the uterus is empty.  The uterus was then closed in 2 running locking imbricating layers of 0 Monocryl suture.  Good hemostasis was obtained.  The tubes and ovaries were normal bilaterally. The uterus was replaced in the abdomen.  Copious irrigation was carried out and thorough inspection revealed good hemostasis all around.  The anterior peritoneum was then closed in a running fashion with 0 Monocryl suture, which was used to reapproximate the pyramidalis muscle in the midline.  Anterior rectus fascia was closed in running fashion with a 0 looped PDS suture.  Subcutaneous tissue was reapproximated with interrupted plain suture and the skin was closed in a subcuticular fashion with 4-0 Vicryl on a Keith  needle.  Benzoin and Steri-Strips were applied.  Pressure dressing was placed.  All counts were correct and the patient was taken to recovery room in stable condition.     Guy Sandifer Henderson Cloud, M.D.     JET/MEDQ  D:  04/29/2015  T:  04/29/2015  Job:  [id number]

## 2015-04-29 NOTE — Anesthesia Preprocedure Evaluation (Signed)
Anesthesia Evaluation  Patient identified by MRN, date of birth, ID band Patient awake    Reviewed: Allergy & Precautions, H&P , Patient's Chart, lab work & pertinent test results  Airway Mallampati: II  TM Distance: >3 FB Neck ROM: full    Dental no notable dental hx.    Pulmonary former smoker,    Pulmonary exam normal breath sounds clear to auscultation       Cardiovascular Exercise Tolerance: Good  Rhythm:regular Rate:Normal     Neuro/Psych    GI/Hepatic   Endo/Other    Renal/GU      Musculoskeletal   Abdominal   Peds  Hematology   Anesthesia Other Findings STAT to OR. Bleeding, FHR is Okay per Dr Henderson Cloudomblin Plan SAB  Reproductive/Obstetrics                             Anesthesia Physical Anesthesia Plan  ASA: II and emergent  Anesthesia Plan: Spinal   Post-op Pain Management:    Induction:   Airway Management Planned:   Additional Equipment:   Intra-op Plan:   Post-operative Plan:   Informed Consent: I have reviewed the patients History and Physical, chart, labs and discussed the procedure including the risks, benefits and alternatives for the proposed anesthesia with the patient or authorized representative who has indicated his/her understanding and acceptance.   Dental Advisory Given  Plan Discussed with: CRNA  Anesthesia Plan Comments: (Lab work confirmed with CRNA in room. Platelets okay. Discussed spinal anesthetic, and patient consents to the procedure:  included risk of possible headache,backache, failed block, allergic reaction, and nerve injury. This patient was asked if she had any questions or concerns before the procedure started. )        Anesthesia Quick Evaluation

## 2015-04-29 NOTE — Anesthesia Procedure Notes (Signed)
Spinal Patient location during procedure: OR Preanesthetic Checklist Completed: patient identified, site marked, surgical consent, pre-op evaluation, timeout performed, IV checked, risks and benefits discussed and monitors and equipment checked Spinal Block Patient position: sitting Prep: DuraPrep Patient monitoring: heart rate, cardiac monitor, continuous pulse ox and blood pressure Approach: midline Location: L3-4 Injection technique: single-shot Needle Needle type: Sprotte  Needle gauge: 24 G Needle length: 9 cm Assessment Sensory level: T4 Additional Notes Spinal Dosage in OR  Bupivicaine ml       1.5 PFMS04   mcg        100    

## 2015-04-29 NOTE — Anesthesia Postprocedure Evaluation (Signed)
Anesthesia Post Note  Patient: Colleen HooverArielle Dalton  Procedure(s) Performed: Procedure(s) (LRB): CESAREAN SECTION (N/A)  Patient location during evaluation: PACU Anesthesia Type: Spinal Level of consciousness: oriented and awake and alert Pain management: pain level controlled Vital Signs Assessment: post-procedure vital signs reviewed and stable Respiratory status: spontaneous breathing, respiratory function stable and patient connected to nasal cannula oxygen Cardiovascular status: blood pressure returned to baseline and stable Postop Assessment: no headache, no backache and spinal receding Anesthetic complications: no    Last Vitals:  Filed Vitals:   04/29/15 0630 04/29/15 0645  BP: 108/71 102/65  Pulse: 81 72  Temp:  36.7 C  Resp: 29 20    Last Pain:  Filed Vitals:   04/29/15 0655  PainSc: 6                  Reino KentJudd, Nikyla Navedo J

## 2015-04-30 ENCOUNTER — Encounter (HOSPITAL_COMMUNITY): Payer: Self-pay | Admitting: Obstetrics and Gynecology

## 2015-04-30 LAB — CBC
HCT: 29 % — ABNORMAL LOW (ref 36.0–46.0)
Hemoglobin: 9.9 g/dL — ABNORMAL LOW (ref 12.0–15.0)
MCH: 31.8 pg (ref 26.0–34.0)
MCHC: 34.1 g/dL (ref 30.0–36.0)
MCV: 93.2 fL (ref 78.0–100.0)
Platelets: 233 10*3/uL (ref 150–400)
RBC: 3.11 MIL/uL — ABNORMAL LOW (ref 3.87–5.11)
RDW: 13.3 % (ref 11.5–15.5)
WBC: 20.5 10*3/uL — ABNORMAL HIGH (ref 4.0–10.5)

## 2015-04-30 MED ORDER — BISACODYL 10 MG RE SUPP
10.0000 mg | Freq: Once | RECTAL | Status: AC
  Administered 2015-04-30: 10 mg via RECTAL
  Filled 2015-04-30: qty 1

## 2015-04-30 NOTE — Progress Notes (Signed)
1Subjective: Postpartum Day 1: Cesarean Delivery Patient reports tolerating PO, + flatus and no problems voiding.   Baby stable in NICU Objective: Vital signs in last 24 hours: Temp:  [97.7 F (36.5 C)-98.4 F (36.9 C)] 97.7 F (36.5 C) (01/09 0030) Pulse Rate:  [54-75] 61 (01/09 0030) Resp:  [16-18] 18 (01/09 0030) BP: (95-101)/(52-62) 101/59 mmHg (01/09 0030) SpO2:  [95 %-99 %] 98 % (01/09 0030)  Physical Exam:  General: alert and cooperative Lochia: appropriate Uterine Fundus: firm Incision: healing well, small old drainage noted on honeycomb dressing DVT Evaluation: No evidence of DVT seen on physical exam. Negative Homan's sign. No cords or calf tenderness. No significant calf/ankle edema.   Recent Labs  04/28/15 1945 04/30/15 0633  HGB 12.2 9.9*  HCT 34.5* 29.0*    Assessment/Plan: Status post Cesarean section. Doing well postoperatively.  Continue current care.  Colleen Dalton G 04/30/2015, 9:10 AM

## 2015-04-30 NOTE — Lactation Note (Signed)
This note was copied from the chart of Colleen Henrietta HooverArielle Chunn. Lactation Consultation Note  Follow up visit.  Mom is obtaining 4-12 mls with each pumping.  Mom c/o nipples and areola itching.  She said this started during pregnancy but worse now since pumping.  Nipples appear slightly red and irritated.  Possible yeast discussed.  Mom has a history of vaginal yeast infections.  Recommended coconut oil for now but if no improvement may need an antifungal.  Hand expression taught and several drops obtained.  She has a pump in style at home.  Mom has been doing skin to skin with baby.  Answered questions and encouraged to call with concerns/assist prn.  Patient Name: Colleen Henrietta Hooverrielle Mccombs WUJWJ'XToday's Date: 04/30/2015     Maternal Data    Feeding Feeding Type: Formula Length of feed: 20 min  LATCH Score/Interventions                      Lactation Tools Discussed/Used     Consult Status      Huston FoleyMOULDEN, Betta Balla S 04/30/2015, 9:44 AM

## 2015-04-30 NOTE — Lactation Note (Signed)
This note was copied from the chart of [name]. Lactation Consultation Note  Mom c/o nipples rubbing inside flange and soreness with pumping. Observed mom pump with 27 mm flange and nipple tight and swollen.  Flange increased to 30 mm and nipple moving freely.  Mom reports more comfort.  Using coconut oil.  Patient Name: [name] Date: 04/30/2015     Maternal Data    Feeding Feeding Type: Breast Milk Length of feed: 25 min  LATCH Score/Interventions                      Lactation Tools Discussed/Used     Consult Status      Colleen Dalton, Colleen Dalton 04/30/2015, 2:48 PM

## 2015-04-30 NOTE — Clinical Social Work Maternal (Signed)
CLINICAL SOCIAL WORK MATERNAL/CHILD NOTE  Patient Details  Name: Colleen Dalton MRN: [id number] Date of Birth: [birth year]  Date:  04/30/2015  Clinical Social Worker Initiating Note:  Seamus Warehime E. Clelia [place], [place] Date/ Time Initiated:  04/30/15/1430     Child's Name:  Levert Feinstein   Legal Guardian:   (Parents: Janace Aris and Ellouise Newer)   Need for Interpreter:  None   Date of Referral:        Reason for Referral:   (No referral-NICU admission)   Referral Source:      Address:  [address], [place], [place] [address]  Phone number:  [phone]   Household Members:  Spouse   Natural Supports (not living in the home):  Immediate Family, Extended Family, Friends (Parents reports having a good support system. MGM is local and MOB states she has a great friend, Florentina Addison, who lives here and is supportive. Paternal grandparents live in [place] and are supportive.  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  CSW did not discuss this during assessment.)   Education:      Surveyor, quantity Resources:  [employer]   Other Resources:      Cultural/Religious Considerations Which May Impact Care: None stated.  Strengths:  Ability to meet basic needs , Compliance with medical plan , Home prepared for child , Understanding of illness (CSW did not discuss pediatric follow up at this time.)   Risk Factors/Current Problems:  Mental Health Concerns  (MOB-Anxiety)   Cognitive State:  Alert , Goal Oriented , Insightful , Linear Thinking , Able to Concentrate    Mood/Affect:  Interested , Calm , Comfortable , Relaxed , Happy    CSW Assessment: CSW met with MOB, FOB and MGM in MOB's first floor room/137 to introduce services, offer support, and complete assessment due to baby's admission to NICU at 30.1 weeks.  Parents were  extremely pleasant, welcoming of CSW's visit, and stated that this is a good time to talk with them.  MOB states she has just finished pumping and reports that this is going well.  FOB left briefly to take milk to NICU for baby.   MOB spoke openly to CSW about her delivery experience and notes that it was a scary experience.  CSW gave space for her to share and begin to process her feelings surrounding this event.  She states she felt well prepared by her OB for what to expect given that she had placenta previa and that she tried to remain calm when she first starting bleeding.  She states she was admitted to the hospital and added that she had previously been admitted in December also and allowed to go home.  She states it was not long after getting here that she began to bleed heavily and reports that this was extremely scary.  She states they were then on their way to the OR for an emergency c-section.  CSW provided supportive brief counseling and acknowledged MOB's feelings of fear as she told her story.  She reports that she thinks she is coping well at this point and is glad that all of that is behind her and baby seems to be doing well.  She reports that she started to feel better after being able to hold baby.  She felt  eager to get to him and states that it seemed to be a very long time before she was able.  She and FOB spoke about two previous losses and how much this baby means to them.   CSW discussed common emotions often experienced after delivery as well as related to the NICU experience.  CSW provided education regarding signs and symptoms of perinatal mood disorders, at which point, MOB spoke about a long history with significant Anxiety.  She reports having been prescribed Xanax prior to pregnancy and stated no plan to resume since she is breast feeding.  CSW encouraged her to speak with her doctor and a lactation consultant regarding the safety of this medication if taken very infrequently as  needed.  MOB states that she rarely took the medication before pregnancy, but that it helped her when she was having a panic attack.  CSW also suggested that she may want to consider trying an SSRI medication, which can help with Anxiety.  MOB reports that she does not like taking medication and feels she would not take it on a daily basis, but is willing to consider medication if Anxiety symptoms increase in the postpartum period.  CSW advised that it takes 4-6 weeks for an SSRI medication to reach a therapeutic level in the body for her awareness.  She stated understanding.  CSW asked MOB about her coping mechanisms.  She reports many healthy ways to cope with stress and anxiety, including breathing exercises, having quiet alone time to process feelings, removing herself from the situation and looking for distractions.  She reports that her husband is a wonderful support person and that he is very good at knowing when she is becoming overly anxious and being able to de-escalate her.  CSW asked if MOB can identify ways to prevent anxiety and she states that having a plan and feeling prepared help her tremendously.  CSW acknowledged how a NICU experience can be highly anxiety provoking and encouraged her to talk to her baby's care team about how we can best support her.  CSW added that parents may benefit from regular Family Conferences and that they can call CSW any time they would like to arrange a meeting with baby's doctors.  CSW added to not be alarmed if CSW or another member of the team calls them to arrange a conference.  CSW recommends that parents ask to step away from the bedside when being updated if they are feeling like information is difficult to process and explained that it is normal in this type of situation to feel like they aren't processing updates well at times.  Parents seemed very appreciative of this conversation with CSW and were very easily engaged.  CSW explained the importance of talking  with CSW and or MD if parents have mental health concerns at any time.  Parents agreed. Parents report having a good support system, including MGM, who lives locally, Paternal Grandparents who live in [place] and have recently taken the couple's dog back home with them, and FOB's sister who lives in [place] and is in the medical field.  MOB reports that she has been a good resource during pregnancy.  Parents state that they have begun preparing the baby's room and that the furniture should arrive next week.  CSW suggests that arranging a nursery can be a good distraction during baby's hospitalization and that they have time to get this done. CSW briefly talked, in general terms, about what to expect from a NICU admission,  and milestones baby must reach in order to be ready for discharge.  CSW encouraged parents to take this experience one day at a time and focus on [place] rather than his surroundings or discharge date.  Parents report feeling like he is in good care and knowing he needs to stay where he is for quite some time.   CSW explained ongoing support services offered by NICU CSW and gave contact information, encouraging them to call anytime.  Parents appeared appreciative of the support offered by CSW.    CSW Plan/Description:  Patient/Family Education , Psychosocial Support and Ongoing Assessment of Needs    Kalman Shan 04/30/2015, 4:45 PM

## 2015-05-01 LAB — RPR: RPR Ser Ql: NONREACTIVE

## 2015-05-01 NOTE — Lactation Note (Signed)
This note was copied from the chart of Colleen Darolyn Haddix. Lactation Consultation Note Baby is in NICU, mom is pumping. RN called LC to room d/t breast pain. Noted tight shiny breast, nipples erect, mom states they are slightly swollen. Mom pumped 20 minutes, I massaged breast to assist to soften breast, ICE applied. Can feel knots in breast, gentle massage to release colostrum. Noted softening after finished. 20ml collected for baby. Collected in bottle labeled w/sticker. Encouraged to wear supportive bra in am. Demonstrated laying flat and massaging upper breast up to axillary area for 5 min. Each side, then rest. Encouraged to pump again in 2 hours to soften breast. Discussed prevention of engorgement, supply and demand, ICE, breast massage, and milk storage. Patient Name: Colleen Dalton Today's Date: 05/01/2015 Reason for consult: Follow-up assessment;Breast/nipple pain   Maternal Data    Feeding Feeding Type: Breast Milk Length of feed: 30 min  LATCH Score/Interventions       Type of Nipple: Everted at rest and after stimulation  Comfort (Breast/Nipple): Engorged, cracked, bleeding, large blisters, severe discomfort Problem noted: Engorgment Intervention(s): Ice;Hand expression           Lactation Tools Discussed/Used Tools: Pump Breast pump type: Double-Electric Breast Pump   Consult Status Consult Status: Follow-up Date: 05/01/15 Follow-up type: In-patient    Colleen Dalton 05/01/2015, 12:08 AM    

## 2015-05-01 NOTE — Progress Notes (Signed)
CSW checked in with MOB at baby's bedside to evaluate how they are coping today.  She was holding baby skin to skin and smiling.  She reports feeling well emotionally and physically at this time and states recovery from surgery has not been too bad at this point.  She states she became engorged last night and was helped by a lactation consultant.  She feels she knows what to do at this point to prevent that from happening again. She reports no questions, concerns or needs at this time.  

## 2015-05-01 NOTE — Lactation Note (Addendum)
This note was copied from the chart of Boy Henrietta HooverArielle Duren. Lactation Consultation Note  Patient Name: Boy Henrietta Hooverrielle Niebla WUJWJ'XToday's Date: 05/01/2015 Reason for consult: Follow-up assessment  NICU baby 2060 hour old, [id number] CGA. Mom reports that she is still engorged, but has not been using ice much today. Enc mom to ice 15 minutes on-15 minutes off in between pumping. Assisted mom to use manual pump and massage tight areas and mom began flowing--even squirting--into the manual pump. Enc mom to use manual pump, massaging and hand expressing until breasts softened. Discussed with mom that engorgement usually lasts 24-48 hours. Enc mom to pump as needed, and if awakened by full breasts during hours of sleep. Discussed with mom that this will not increase her supply, but will keep from reducing her supply. Discussed the benefits of hospital grade pump after D/C, and [facility]. Discussed assessment and interventions with patient's nurse Tresa EndoKelly, RN. Maternal Data    Feeding Feeding Type: Breast Milk Length of feed: 30 min  LATCH Score/Interventions                      Lactation Tools Discussed/Used Tools: Pump Breast pump type: Manual   Consult Status Consult Status: Follow-up Date: 05/02/15 Follow-up type: In-patient    Geralynn OchsWILLIARD, Kamarri Fischetti 05/01/2015, 6:12 PM

## 2015-05-01 NOTE — Progress Notes (Signed)
Subjective: Postpartum Day 2: Cesarean Delivery Patient reports tolerating PO, + BM and no problems voiding.  Baby stable in NICU  Objective: Vital signs in last 24 hours: Temp:  [98.1 F (36.7 C)-98.4 F (36.9 C)] 98.4 F (36.9 C) (01/10 0553) Pulse Rate:  [64-79] 79 (01/10 0553) Resp:  [20] 20 (01/10 0553) BP: (113-114)/(63-73) 113/73 mmHg (01/10 0553)  Physical Exam:  General: alert and cooperative Lochia: appropriate Uterine Fundus: firm Incision: healing well DVT Evaluation: No evidence of DVT seen on physical exam. Negative Homan's sign. No cords or calf tenderness. No significant calf/ankle edema.   Recent Labs  04/28/15 1945 04/30/15 0633  HGB 12.2 9.9*  HCT 34.5* 29.0*    Assessment/Plan: Status post Cesarean section. Doing well postoperatively.  Continue current care.  Merriam Brandner G 05/01/2015, 8:03 AM

## 2015-05-02 ENCOUNTER — Ambulatory Visit: Payer: Self-pay

## 2015-05-02 MED ORDER — OXYCODONE-ACETAMINOPHEN 5-325 MG PO TABS: 1.0000 | ORAL_TABLET | ORAL | Status: DC | PRN

## 2015-05-02 MED ORDER — IBUPROFEN 600 MG PO TABS: 600.0000 mg | ORAL_TABLET | Freq: Four times a day (QID) | ORAL | Status: DC

## 2015-05-02 NOTE — Discharge Summary (Signed)
Obstetric Discharge Summary Reason for Admission: observation/evaluation Prenatal Procedures: ultrasound Intrapartum Procedures: cesarean: low cervical, transverse Postpartum Procedures: none Complications-Operative and Postpartum: none HEMOGLOBIN  Date Value Ref Range Status  04/30/2015 9.9* 12.0 - 15.0 g/dL Final   HCT  Date Value Ref Range Status  04/30/2015 29.0* 36.0 - 46.0 % Final    Physical Exam:  General: alert and cooperative Lochia: appropriate Uterine Fundus: firm Incision: healing well DVT Evaluation: No evidence of DVT seen on physical exam. Negative Homan's sign. No cords or calf tenderness. No significant calf/ankle edema.  Discharge Diagnoses: s/p cesarean delivery @ 30 weeks , placental previa  Discharge Information: Date: 05/02/2015 Activity: pelvic rest Diet: routine Medications: PNV, Ibuprofen and Percocet Condition: stable Instructions: refer to practice specific booklet Discharge to: home   Newborn Data:   Rowland LatheKerr, BoyB Tapanga [[id number]][[id number]]  Live born female  Birth Weight:   APGARMarland Kitchen: ,    Sundra AlandKerr, Boy Taccara [[id number]][[id number]]  Live born female  Birth Weight: 3 lb 3.9 oz (1470 g) APGAR: 3, 8  Baby stable in NICU  Sparkle Aube G 05/02/2015, 8:30 AM

## 2015-05-02 NOTE — Lactation Note (Signed)
This note was copied from the chart of Colleen Henrietta HooverArielle Thome. Lactation Consultation Note Follow up visit made prior to mom's discharge.  Engorgement resolving and she last pumped 88 mls.  Nipples more comfortable and itching gone.  2 week pump rental completed.  Parents doing a lot of skin to skin.  Encouraged to call with concerns prn.  Patient Name: Colleen Dalton ZOXWR'UToday's Date: 05/02/2015     Maternal Data    Feeding Feeding Type: Breast Milk Length of feed: 30 min  LATCH Score/Interventions                      Lactation Tools Discussed/Used     Consult Status      Colleen Dalton, Colleen Dalton S 05/02/2015, 12:05 PM

## 2015-05-04 ENCOUNTER — Ambulatory Visit: Payer: Self-pay

## 2015-05-04 NOTE — Lactation Note (Signed)
This note was copied from the chart of Colleen Montez Morita. Lactation Consultation Note  Met with mom in the NICU.  She just finished pumping and concerned breast is not completely softened.  Mom still has some engorgement.  Recommended she continue to use ice packs.  She states she is only pumps 10 minutes and obtains 80 mls.  Instructed to increase pumping to 15-20 minutes followed by hand expression.  Encouraged to call with concerns prn.  Patient Name: Colleen Colleen Dalton Date: 05/04/2015     Maternal Data    Feeding Feeding Type: Breast Milk Length of feed: 30 min  LATCH Score/Interventions                      Lactation Tools Discussed/Used     Consult Status      Ave Filter 05/04/2015, 11:02 AM

## 2015-05-07 ENCOUNTER — Encounter (HOSPITAL_COMMUNITY): Payer: Self-pay | Admitting: *Deleted

## 2015-05-11 ENCOUNTER — Ambulatory Visit: Payer: Self-pay

## 2015-05-11 NOTE — Lactation Note (Signed)
This note was copied from the chart of Colleen Karisa Flax. Lactation Consultation Note  Mom reports good milk supply and no problems with breasts.  Encouraged to call prn.  Patient Name: Colleen Dalton Today's Date: 05/11/2015     Maternal Data    Feeding Feeding Type: Breast Milk Length of feed: 75 min  LATCH Score/Interventions                      Lactation Tools Discussed/Used     Consult Status      Layaan Mott S 05/11/2015, 9:42 AM    

## 2015-05-31 ENCOUNTER — Ambulatory Visit: Payer: Self-pay

## 2015-05-31 NOTE — Lactation Note (Signed)
This note was copied from a baby's chart. Lactation Consultation Note  Assisted mom with breastfeeding attempt.  Baby now at 34.[redacted] weeks gestation.  Mom has been nuzzling baby at breast once per day and he sometimes suckles.  Mom has an excellent milk supply.  Reviewed normal preterm feeding behavior and mom has realistic expectations.  Baby placed skin to skin in football hold.  Kevan Ny is very sleepy.  Mom expressed a few drops of milk and rubbed on his lips.  No interest or feeding cues exhibited.  Reminded mom that any time at breast is a success and to expect inconsistency until baby reaches term or thereafter.  Praised mom for her pumping efforts.  Encouraged to call with any concerns or assist prn.  Patient Name: Colleen Dalton [id number] Date: 05/31/2015 Reason for consult: Follow-up assessment;NICU baby   Maternal Data    Feeding Feeding Type: Breast Fed  LATCH Score/Interventions Latch: Too sleepy or reluctant, no latch achieved, no sucking elicited. Intervention(s): Skin to skin  Audible Swallowing: None  Type of Nipple: Everted at rest and after stimulation  Comfort (Breast/Nipple): Soft / non-tender     Hold (Positioning): Assistance needed to correctly position infant at breast and maintain latch. Intervention(s): Breastfeeding basics reviewed;Support Pillows;Position options;Skin to skin  LATCH Score: 5  Lactation Tools Discussed/Used     Consult Status Consult Status: PRN    Huston Foley 05/31/2015, 12:01 PM

## 2015-06-18 ENCOUNTER — Inpatient Hospital Stay (HOSPITAL_COMMUNITY)
Admission: RE | Admit: 2015-06-18 | Payer: BLUE CROSS/BLUE SHIELD | Source: Ambulatory Visit | Admitting: Obstetrics and Gynecology

## 2015-06-18 ENCOUNTER — Encounter (HOSPITAL_COMMUNITY): Admission: RE | Payer: Self-pay | Source: Ambulatory Visit

## 2015-06-18 SURGERY — Surgical Case
Anesthesia: Regional

## 2015-07-20 ENCOUNTER — Encounter (HOSPITAL_COMMUNITY): Payer: Self-pay

## 2015-11-08 ENCOUNTER — Other Ambulatory Visit: Payer: Self-pay | Admitting: Obstetrics and Gynecology

## 2015-11-08 DIAGNOSIS — N6002 Solitary cyst of left breast: Secondary | ICD-10-CM | POA: Diagnosis not present

## 2015-11-09 ENCOUNTER — Ambulatory Visit
Admission: RE | Admit: 2015-11-09 | Discharge: 2015-11-09 | Disposition: A | Discharge status: Home / Self Care | Payer: BLUE CROSS/BLUE SHIELD | Source: Ambulatory Visit | Attending: Obstetrics and Gynecology | Admitting: Obstetrics and Gynecology

## 2015-11-09 DIAGNOSIS — N6002 Solitary cyst of left breast: Secondary | ICD-10-CM

## 2015-11-09 DIAGNOSIS — N63 Unspecified lump in breast: Secondary | ICD-10-CM | POA: Diagnosis not present

## 2015-11-26 DIAGNOSIS — N63 Unspecified lump in breast: Secondary | ICD-10-CM | POA: Diagnosis not present

## 2015-12-06 DIAGNOSIS — N938 Other specified abnormal uterine and vaginal bleeding: Secondary | ICD-10-CM | POA: Diagnosis not present

## 2015-12-07 DIAGNOSIS — D224 Melanocytic nevi of scalp and neck: Secondary | ICD-10-CM | POA: Diagnosis not present

## 2015-12-07 DIAGNOSIS — D2271 Melanocytic nevi of right lower limb, including hip: Secondary | ICD-10-CM | POA: Diagnosis not present

## 2015-12-07 DIAGNOSIS — L723 Sebaceous cyst: Secondary | ICD-10-CM | POA: Diagnosis not present

## 2015-12-07 DIAGNOSIS — D235 Other benign neoplasm of skin of trunk: Secondary | ICD-10-CM | POA: Diagnosis not present

## 2016-01-14 DIAGNOSIS — M25552 Pain in left hip: Secondary | ICD-10-CM | POA: Diagnosis not present

## 2016-01-14 DIAGNOSIS — Z23 Encounter for immunization: Secondary | ICD-10-CM | POA: Diagnosis not present

## 2016-01-14 DIAGNOSIS — M545 Low back pain: Secondary | ICD-10-CM | POA: Diagnosis not present

## 2016-02-06 DIAGNOSIS — Z0000 Encounter for general adult medical examination without abnormal findings: Secondary | ICD-10-CM | POA: Diagnosis not present

## 2016-03-21 DIAGNOSIS — H6693 Otitis media, unspecified, bilateral: Secondary | ICD-10-CM | POA: Diagnosis not present

## 2016-03-24 DIAGNOSIS — F900 Attention-deficit hyperactivity disorder, predominantly inattentive type: Secondary | ICD-10-CM | POA: Diagnosis not present

## 2016-04-07 ENCOUNTER — Other Ambulatory Visit: Payer: Self-pay | Admitting: Obstetrics and Gynecology

## 2016-04-07 DIAGNOSIS — N63 Unspecified lump in unspecified breast: Secondary | ICD-10-CM

## 2016-05-07 ENCOUNTER — Other Ambulatory Visit: Payer: BLUE CROSS/BLUE SHIELD

## 2016-05-08 ENCOUNTER — Other Ambulatory Visit: Payer: BLUE CROSS/BLUE SHIELD

## 2016-06-11 DIAGNOSIS — Z01419 Encounter for gynecological examination (general) (routine) without abnormal findings: Secondary | ICD-10-CM | POA: Diagnosis not present

## 2016-06-11 DIAGNOSIS — Z6824 Body mass index (BMI) 24.0-24.9, adult: Secondary | ICD-10-CM | POA: Diagnosis not present

## 2016-06-23 DIAGNOSIS — F900 Attention-deficit hyperactivity disorder, predominantly inattentive type: Secondary | ICD-10-CM | POA: Diagnosis not present

## 2016-06-26 ENCOUNTER — Ambulatory Visit
Admission: RE | Admit: 2016-06-26 | Discharge: 2016-06-26 | Disposition: A | Discharge status: Home / Self Care | Payer: BLUE CROSS/BLUE SHIELD | Source: Ambulatory Visit | Attending: Obstetrics and Gynecology | Admitting: Obstetrics and Gynecology

## 2016-06-26 DIAGNOSIS — N63 Unspecified lump in unspecified breast: Secondary | ICD-10-CM

## 2016-06-26 DIAGNOSIS — N6489 Other specified disorders of breast: Secondary | ICD-10-CM | POA: Diagnosis not present

## 2016-11-12 DIAGNOSIS — H52223 Regular astigmatism, bilateral: Secondary | ICD-10-CM | POA: Diagnosis not present

## 2016-12-04 DIAGNOSIS — H04552 Acquired stenosis of left nasolacrimal duct: Secondary | ICD-10-CM | POA: Diagnosis not present

## 2016-12-04 DIAGNOSIS — D2272 Melanocytic nevi of left lower limb, including hip: Secondary | ICD-10-CM | POA: Diagnosis not present

## 2016-12-04 DIAGNOSIS — D225 Melanocytic nevi of trunk: Secondary | ICD-10-CM | POA: Diagnosis not present

## 2016-12-04 DIAGNOSIS — D224 Melanocytic nevi of scalp and neck: Secondary | ICD-10-CM | POA: Diagnosis not present

## 2016-12-04 DIAGNOSIS — D2271 Melanocytic nevi of right lower limb, including hip: Secondary | ICD-10-CM | POA: Diagnosis not present

## 2016-12-24 DIAGNOSIS — F900 Attention-deficit hyperactivity disorder, predominantly inattentive type: Secondary | ICD-10-CM | POA: Diagnosis not present

## 2016-12-24 DIAGNOSIS — F419 Anxiety disorder, unspecified: Secondary | ICD-10-CM | POA: Diagnosis not present

## 2016-12-24 DIAGNOSIS — Z23 Encounter for immunization: Secondary | ICD-10-CM | POA: Diagnosis not present

## 2017-01-21 DIAGNOSIS — L989 Disorder of the skin and subcutaneous tissue, unspecified: Secondary | ICD-10-CM | POA: Diagnosis not present

## 2017-01-21 DIAGNOSIS — L720 Epidermal cyst: Secondary | ICD-10-CM | POA: Diagnosis not present

## 2017-01-21 DIAGNOSIS — L723 Sebaceous cyst: Secondary | ICD-10-CM | POA: Diagnosis not present

## 2017-01-21 DIAGNOSIS — Z30432 Encounter for removal of intrauterine contraceptive device: Secondary | ICD-10-CM | POA: Diagnosis not present

## 2017-01-28 ENCOUNTER — Other Ambulatory Visit (HOSPITAL_COMMUNITY): Payer: Self-pay | Admitting: Obstetrics and Gynecology

## 2017-01-28 DIAGNOSIS — Z3141 Encounter for fertility testing: Secondary | ICD-10-CM

## 2017-01-29 ENCOUNTER — Ambulatory Visit (HOSPITAL_COMMUNITY)
Admission: RE | Admit: 2017-01-29 | Discharge: 2017-01-29 | Disposition: A | Discharge status: Home / Self Care | Payer: BLUE CROSS/BLUE SHIELD | Source: Ambulatory Visit | Attending: Obstetrics and Gynecology | Admitting: Obstetrics and Gynecology

## 2017-01-29 DIAGNOSIS — Z3141 Encounter for fertility testing: Secondary | ICD-10-CM | POA: Diagnosis not present

## 2017-01-29 MED ORDER — IOPAMIDOL (ISOVUE-300) INJECTION 61%
30.0000 mL | Freq: Once | INTRAVENOUS | Status: AC | PRN
  Administered 2017-01-29: 20 mL

## 2017-01-30 ENCOUNTER — Ambulatory Visit (HOSPITAL_COMMUNITY): Payer: BLUE CROSS/BLUE SHIELD

## 2017-02-06 DIAGNOSIS — J0100 Acute maxillary sinusitis, unspecified: Secondary | ICD-10-CM | POA: Diagnosis not present

## 2017-02-06 DIAGNOSIS — Z0000 Encounter for general adult medical examination without abnormal findings: Secondary | ICD-10-CM | POA: Diagnosis not present

## 2017-06-01 DIAGNOSIS — N912 Amenorrhea, unspecified: Secondary | ICD-10-CM | POA: Diagnosis not present

## 2017-06-03 DIAGNOSIS — N912 Amenorrhea, unspecified: Secondary | ICD-10-CM | POA: Diagnosis not present

## 2017-06-18 DIAGNOSIS — N911 Secondary amenorrhea: Secondary | ICD-10-CM | POA: Diagnosis not present

## 2017-07-06 DIAGNOSIS — Z3685 Encounter for antenatal screening for Streptococcus B: Secondary | ICD-10-CM | POA: Diagnosis not present

## 2017-07-06 DIAGNOSIS — Z3481 Encounter for supervision of other normal pregnancy, first trimester: Secondary | ICD-10-CM | POA: Diagnosis not present

## 2017-07-06 LAB — OB RESULTS CONSOLE GBS: GBS: POSITIVE

## 2017-07-06 LAB — OB RESULTS CONSOLE HEPATITIS B SURFACE ANTIGEN: Hepatitis B Surface Ag: NEGATIVE

## 2017-07-06 LAB — OB RESULTS CONSOLE RPR: RPR: NONREACTIVE

## 2017-07-06 LAB — OB RESULTS CONSOLE HIV ANTIBODY (ROUTINE TESTING): HIV: NONREACTIVE

## 2017-07-06 LAB — OB RESULTS CONSOLE RUBELLA ANTIBODY, IGM: Rubella: IMMUNE

## 2017-07-06 LAB — OB RESULTS CONSOLE GC/CHLAMYDIA
Chlamydia: NEGATIVE
Gonorrhea: NEGATIVE

## 2017-07-06 LAB — OB RESULTS CONSOLE ANTIBODY SCREEN: Antibody Screen: NEGATIVE

## 2017-07-06 LAB — OB RESULTS CONSOLE ABO/RH: RH Type: POSITIVE

## 2017-07-23 DIAGNOSIS — Z3480 Encounter for supervision of other normal pregnancy, unspecified trimester: Secondary | ICD-10-CM | POA: Diagnosis not present

## 2017-07-23 DIAGNOSIS — Z3491 Encounter for supervision of normal pregnancy, unspecified, first trimester: Secondary | ICD-10-CM | POA: Diagnosis not present

## 2017-07-23 DIAGNOSIS — Z113 Encounter for screening for infections with a predominantly sexual mode of transmission: Secondary | ICD-10-CM | POA: Diagnosis not present

## 2017-08-03 DIAGNOSIS — Z3682 Encounter for antenatal screening for nuchal translucency: Secondary | ICD-10-CM | POA: Diagnosis not present

## 2017-08-03 DIAGNOSIS — Z3A12 12 weeks gestation of pregnancy: Secondary | ICD-10-CM | POA: Diagnosis not present

## 2017-09-23 DIAGNOSIS — Z363 Encounter for antenatal screening for malformations: Secondary | ICD-10-CM | POA: Diagnosis not present

## 2017-09-23 DIAGNOSIS — Z3A20 20 weeks gestation of pregnancy: Secondary | ICD-10-CM | POA: Diagnosis not present

## 2017-10-26 DIAGNOSIS — Z3480 Encounter for supervision of other normal pregnancy, unspecified trimester: Secondary | ICD-10-CM | POA: Diagnosis not present

## 2017-12-10 DIAGNOSIS — D2271 Melanocytic nevi of right lower limb, including hip: Secondary | ICD-10-CM | POA: Diagnosis not present

## 2017-12-10 DIAGNOSIS — D225 Melanocytic nevi of trunk: Secondary | ICD-10-CM | POA: Diagnosis not present

## 2017-12-10 DIAGNOSIS — D485 Neoplasm of uncertain behavior of skin: Secondary | ICD-10-CM | POA: Diagnosis not present

## 2017-12-10 DIAGNOSIS — D224 Melanocytic nevi of scalp and neck: Secondary | ICD-10-CM | POA: Diagnosis not present

## 2017-12-10 DIAGNOSIS — D2272 Melanocytic nevi of left lower limb, including hip: Secondary | ICD-10-CM | POA: Diagnosis not present

## 2017-12-14 ENCOUNTER — Other Ambulatory Visit: Payer: Self-pay

## 2017-12-14 ENCOUNTER — Encounter (HOSPITAL_COMMUNITY): Payer: Self-pay | Admitting: *Deleted

## 2017-12-14 ENCOUNTER — Inpatient Hospital Stay (HOSPITAL_COMMUNITY)
Admission: AD | Admit: 2017-12-14 | Discharge: 2017-12-14 | Disposition: A | Discharge status: Home / Self Care | Payer: BLUE CROSS/BLUE SHIELD | Source: Ambulatory Visit | Attending: Obstetrics and Gynecology | Admitting: Obstetrics and Gynecology

## 2017-12-14 DIAGNOSIS — R109 Unspecified abdominal pain: Secondary | ICD-10-CM

## 2017-12-14 DIAGNOSIS — O26891 Other specified pregnancy related conditions, first trimester: Secondary | ICD-10-CM | POA: Diagnosis not present

## 2017-12-14 DIAGNOSIS — O26893 Other specified pregnancy related conditions, third trimester: Secondary | ICD-10-CM | POA: Insufficient documentation

## 2017-12-14 DIAGNOSIS — Z3A31 31 weeks gestation of pregnancy: Secondary | ICD-10-CM | POA: Diagnosis not present

## 2017-12-14 DIAGNOSIS — Z87891 Personal history of nicotine dependence: Secondary | ICD-10-CM | POA: Insufficient documentation

## 2017-12-14 DIAGNOSIS — O99343 Other mental disorders complicating pregnancy, third trimester: Secondary | ICD-10-CM | POA: Diagnosis not present

## 2017-12-14 DIAGNOSIS — F329 Major depressive disorder, single episode, unspecified: Secondary | ICD-10-CM | POA: Diagnosis not present

## 2017-12-14 DIAGNOSIS — N898 Other specified noninflammatory disorders of vagina: Secondary | ICD-10-CM | POA: Insufficient documentation

## 2017-12-14 DIAGNOSIS — Z0371 Encounter for suspected problem with amniotic cavity and membrane ruled out: Secondary | ICD-10-CM

## 2017-12-14 LAB — AMNISURE RUPTURE OF MEMBRANE (ROM) NOT AT ARMC: Amnisure ROM: NEGATIVE

## 2017-12-14 NOTE — MAU Note (Addendum)
Was picking up her son from school.  Went to the restroom, noted an increase in vag d/c, about later, noted wetness, underwear was soaked.  Hx of PPROM with first preg. No out of the normal pain.

## 2017-12-14 NOTE — MAU Provider Note (Signed)
History     CSN: [id number]  Arrival date and time: 12/14/17 1724   First Provider Initiated Contact with Patient 12/14/17 1834     Chief Complaint  Patient presents with  . Rupture of Membranes  . Abdominal Pain  . Back Pain   HPI Colleen Dalton is a 37 y.o. [id number] at [redacted]w[redacted]d who presents with possible rupture of membranes. She states she had 2 big gushes of fluid that saturated her underwear this afternoon. None since. She denies any pain or vaginal bleeding. Reports good fetal movement. She reports a hx of PPROM with her first pregnancy with delivery at 30 weeks. Denies any problems during this pregnancy.   OB History    Gravida  4   Para  1   Term      Preterm  1   AB  2   Living  1     SAB  2   TAB      Ectopic      Multiple  0   Live Births  1          Past Medical History:  Diagnosis Date  . Anxiety   . History of cryosurgery   . Vaginal Pap smear, abnormal    Past Surgical History:  Procedure Laterality Date  . CESAREAN SECTION N/A 04/29/2015   Procedure: CESAREAN SECTION;  Surgeon: Harold Hedge, MD;  Location: [facility];  Service: Obstetrics;  Laterality: N/A;  . COLPOSCOPY    . EYE SURGERY     as child    Family History  Problem Relation Age of Onset  . Asthma Father   . Cancer Father        prostate and colon  . Deep vein thrombosis Maternal Aunt   . Cancer Maternal Aunt        x2 aunts thyroid cancer  . Alcohol abuse Paternal Aunt   . Alcohol abuse Paternal Uncle   . Asthma Maternal Grandmother   . Kidney disease Maternal Grandmother   . Heart disease Maternal Grandmother   . Diabetes Maternal Grandmother   . Kidney disease Maternal Grandfather     Social History   Tobacco Use  . Smoking status: Former Smoker    Last attempt to quit: 04/23/1999    Years since quitting: 18.6  . Smokeless tobacco: Never Used  Substance Use Topics  . Alcohol use: No  . Drug use: No    Allergies: No Known Allergies  Medications Prior to  Admission  Medication Sig Dispense Refill Last Dose  . Docosahexaenoic Acid (PRENATAL DHA PO) Take 1 tablet by mouth 2 (two) times daily.   Past Week at Unknown time  . ibuprofen (ADVIL,MOTRIN) 600 MG tablet Take 1 tablet (600 mg total) by mouth every 6 (six) hours. 30 tablet 1   . oxyCODONE-acetaminophen (PERCOCET/ROXICET) 5-325 MG tablet Take 1 tablet by mouth every 4 (four) hours as needed for moderate pain. 30 tablet 0     Review of Systems  Constitutional: Negative.  Negative for fatigue and fever.  HENT: Negative.   Respiratory: Negative.  Negative for shortness of breath.   Cardiovascular: Negative.  Negative for chest pain.  Gastrointestinal: Negative.  Negative for abdominal pain, constipation, diarrhea, nausea and vomiting.  Genitourinary: Positive for vaginal discharge. Negative for dysuria and vaginal bleeding.  Neurological: Negative.  Negative for dizziness and headaches.   Physical Exam   Blood pressure 124/77, pulse 91, temperature 98.1 F (36.7 C), temperature source Oral, resp. rate 18, weight 82.8  kg, last menstrual period 01/23/2017, SpO2 100 %, unknown if currently breastfeeding.  Physical Exam  Nursing note and vitals reviewed. Constitutional: She is oriented to person, place, and time. She appears well-developed and well-nourished. No distress.  HENT:  Head: Normocephalic.  Eyes: Pupils are equal, round, and reactive to light.  Cardiovascular: Normal rate, regular rhythm and normal heart sounds.  Respiratory: Effort normal and breath sounds normal. No respiratory distress.  GI: Soft. Bowel sounds are normal. She exhibits no distension. There is no tenderness.  Genitourinary: Vaginal discharge found.  Genitourinary Comments: Pelvic exam: Cervix pink, visually closed, without lesion, scant white creamy discharge, vaginal walls and external genitalia normal  Neurological: She is alert and oriented to person, place, and time.  Skin: Skin is warm and dry.   Psychiatric: She has a normal mood and affect. Her behavior is normal. Judgment and thought content normal.   Fetal Tracing:  Baseline: 130 Variability: moderate Accels: 15x15 Decels: none  Toco: none  MAU Course  Procedures Results for orders placed or performed during the hospital encounter of 12/14/17 (from the past 24 hour(s))  [facility]     Status: None   Collection Time: 12/14/17  6:44 PM  Result Value Ref Range   Amnisure ROM NEGATIVE    MDM Prenatal records from private office reviewed. Labs ordered and reviewed.  Amnisure Consulted with Dr. Elon SpannerLeger- ok to discharge patient home to follow up in the office.  Assessment and Plan   1. Encounter for suspected premature rupture of amniotic membranes, with rupture of membranes not found   2. [redacted] weeks gestation of pregnancy    -Discharge home in stable condition -Preterm labor precautions discussed -Patient advised to follow-up with [facility] as scheduled for prenatal care -Patient may return to MAU as needed or if her condition were to change or worsen   Rolm BookbinderCaroline M Darrien Laakso CNM 12/14/2017, 6:38 PM

## 2017-12-14 NOTE — Discharge Instructions (Signed)
Braxton Hicks Contractions °Contractions of the uterus can occur throughout pregnancy, but they are not always a sign that you are in labor. You may have practice contractions called Braxton Hicks contractions. These false labor contractions are sometimes confused with true labor. °What are Braxton Hicks contractions? °Braxton Hicks contractions are tightening movements that occur in the muscles of the uterus before labor. Unlike true labor contractions, these contractions do not result in opening (dilation) and thinning of the cervix. Toward the end of pregnancy (32-34 weeks), Braxton Hicks contractions can happen more often and may become stronger. These contractions are sometimes difficult to tell apart from true labor because they can be very uncomfortable. You should not feel embarrassed if you go to the hospital with false labor. °Sometimes, the only way to tell if you are in true labor is for your health care provider to look for changes in the cervix. The health care provider will do a physical exam and may monitor your contractions. If you are not in true labor, the exam should show that your cervix is not dilating and your water has not broken. °If there are other health problems associated with your pregnancy, it is completely safe for you to be sent home with false labor. You may continue to have Braxton Hicks contractions until you go into true labor. °How to tell the difference between true labor and false labor °True labor °· Contractions last 30-70 seconds. °· Contractions become very regular. °· Discomfort is usually felt in the top of the uterus, and it spreads to the lower abdomen and low back. °· Contractions do not go away with walking. °· Contractions usually become more intense and increase in frequency. °· The cervix dilates and gets thinner. °False labor °· Contractions are usually shorter and not as strong as true labor contractions. °· Contractions are usually irregular. °· Contractions  are often felt in the front of the lower abdomen and in the groin. °· Contractions may go away when you walk around or change positions while lying down. °· Contractions get weaker and are shorter-lasting as time goes on. °· The cervix usually does not dilate or become thin. °Follow these instructions at home: °· Take over-the-counter and prescription medicines only as told by your health care provider. °· Keep up with your usual exercises and follow other instructions from your health care provider. °· Eat and drink lightly if you think you are going into labor. °· If Braxton Hicks contractions are making you uncomfortable: °? Change your position from lying down or resting to walking, or change from walking to resting. °? Sit and rest in a tub of warm water. °? Drink enough fluid to keep your urine pale yellow. Dehydration may cause these contractions. °? Do slow and deep breathing several times an hour. °· Keep all follow-up prenatal visits as told by your health care provider. This is important. °Contact a health care provider if: °· You have a fever. °· You have continuous pain in your abdomen. °Get help right away if: °· Your contractions become stronger, more regular, and closer together. °· You have fluid leaking or gushing from your vagina. °· You pass blood-tinged mucus (bloody show). °· You have bleeding from your vagina. °· You have low back pain that you never had before. °· You feel your baby’s head pushing down and causing pelvic pressure. °· Your baby is not moving inside you as much as it used to. °Summary °· Contractions that occur before labor are called Braxton   Hicks contractions, false labor, or practice contractions. °· Braxton Hicks contractions are usually shorter, weaker, farther apart, and less regular than true labor contractions. True labor contractions usually become progressively stronger and regular and they become more frequent. °· Manage discomfort from Braxton Hicks contractions by  changing position, resting in a warm bath, drinking plenty of water, or practicing deep breathing. °This information is not intended to replace advice given to you by your health care provider. Make sure you discuss any questions you have with your health care provider. °Document Released: 08/21/2016 Document Revised: 08/21/2016 Document Reviewed: 08/21/2016 °Elsevier Interactive Patient Education © 2018 [employer] ° °Fetal Movement Counts °Patient Name: ________________________________________________ Patient Due Date: ____________________ °What is a fetal movement count? °A fetal movement count is the number of times that you feel your baby move during a certain amount of time. This may also be called a fetal kick count. A fetal movement count is recommended for every pregnant woman. You may be asked to start counting fetal movements as early as week 28 of your pregnancy. °Pay attention to when your baby is most active. You may notice your baby's sleep and wake cycles. You may also notice things that make your baby move more. You should do a fetal movement count: °· When your baby is normally most active. °· At the same time each day. ° °A good time to count movements is while you are resting, after having something to eat and drink. °How do I count fetal movements? °1. Find a quiet, comfortable area. Sit, or lie down on your side. °2. Write down the date, the start time and stop time, and the number of movements that you felt between those two times. Take this information with you to your health care visits. °3. For 2 hours, count kicks, flutters, swishes, rolls, and jabs. You should feel at least 10 movements during 2 hours. °4. You may stop counting after you have felt 10 movements. °5. If you do not feel 10 movements in 2 hours, have something to eat and drink. Then, keep resting and counting for 1 hour. If you feel at least 4 movements during that hour, you may stop counting. °Contact a health care  provider if: °· You feel fewer than 4 movements in 2 hours. °· Your baby is not moving like he or she usually does. °Date: ____________ Start time: ____________ Stop time: ____________ Movements: ____________ °Date: ____________ Start time: ____________ Stop time: ____________ Movements: ____________ °Date: ____________ Start time: ____________ Stop time: ____________ Movements: ____________ °Date: ____________ Start time: ____________ Stop time: ____________ Movements: ____________ °Date: ____________ Start time: ____________ Stop time: ____________ Movements: ____________ °Date: ____________ Start time: ____________ Stop time: ____________ Movements: ____________ °Date: ____________ Start time: ____________ Stop time: ____________ Movements: ____________ °Date: ____________ Start time: ____________ Stop time: ____________ Movements: ____________ °Date: ____________ Start time: ____________ Stop time: ____________ Movements: ____________ °This information is not intended to replace advice given to you by your health care provider. Make sure you discuss any questions you have with your health care provider. °Document Released: 05/07/2006 Document Revised: 12/05/2015 Document Reviewed: 05/17/2015 °Elsevier Interactive Patient Education © 2018 [employer] Inc[employer] ° °

## 2017-12-14 NOTE — MAU Note (Signed)
Urine sent to lab 

## 2018-01-06 DIAGNOSIS — Z1159 Encounter for screening for other viral diseases: Secondary | ICD-10-CM | POA: Diagnosis not present

## 2018-01-06 DIAGNOSIS — Z3480 Encounter for supervision of other normal pregnancy, unspecified trimester: Secondary | ICD-10-CM | POA: Diagnosis not present

## 2018-01-11 DIAGNOSIS — Z113 Encounter for screening for infections with a predominantly sexual mode of transmission: Secondary | ICD-10-CM | POA: Diagnosis not present

## 2018-01-11 DIAGNOSIS — Z299 Encounter for prophylactic measures, unspecified: Secondary | ICD-10-CM | POA: Diagnosis not present

## 2018-01-19 ENCOUNTER — Encounter (HOSPITAL_COMMUNITY): Payer: Self-pay

## 2018-01-19 ENCOUNTER — Telehealth (HOSPITAL_COMMUNITY): Payer: Self-pay | Admitting: *Deleted

## 2018-01-19 NOTE — Telephone Encounter (Signed)
Preadmission screen  

## 2018-01-20 ENCOUNTER — Encounter (HOSPITAL_COMMUNITY): Payer: Self-pay

## 2018-01-21 DIAGNOSIS — Z23 Encounter for immunization: Secondary | ICD-10-CM | POA: Diagnosis not present

## 2018-01-26 NOTE — H&P (Signed)
Colleen Dalton is a 37 y.o. G 5 P 0131 at 39 weeks presents for Repeat LTCS OB History    Gravida  5   Para  1   Term      Preterm  1   AB  3   Living  1     SAB  2   TAB  1   Ectopic      Multiple  0   Live Births  1          Past Medical History:  Diagnosis Date  . Anxiety   . History of cryosurgery   . Vaginal Pap smear, abnormal    Past Surgical History:  Procedure Laterality Date  . CESAREAN SECTION N/A 04/29/2015   Procedure: CESAREAN SECTION;  Surgeon: Harold Hedge, MD;  Location: [facility];  Service: Obstetrics;  Laterality: N/A;  . COLPOSCOPY    . EYE SURGERY     as child   Family History: family history includes Alcohol abuse in her paternal aunt and paternal uncle; Asthma in her maternal grandmother; Cancer in her father; Deep vein thrombosis in her maternal aunt; Diabetes in her maternal grandmother; Luiz Blare' disease in her maternal aunt; Heart disease in her maternal grandmother; Heart failure in her maternal grandmother; Hypertension in her father; Kidney disease in her maternal grandfather and maternal grandmother; Rheum arthritis in her father; Thyroid cancer in her maternal grandmother. Social History:  reports that she quit smoking about 18 years ago. She has never used smokeless tobacco. She reports that she does not drink alcohol or use drugs.     Maternal Diabetes: No Genetic Screening: Normal Maternal Ultrasounds/Referrals: Normal Fetal Ultrasounds or other Referrals:  None Maternal Substance Abuse:  No Significant Maternal Medications:  None Significant Maternal Lab Results:  None Other Comments:  None  Review of Systems  All other systems reviewed and are negative.  History   Last menstrual period 01/23/2017, unknown if currently breastfeeding. Maternal Exam:  Abdomen: Fetal presentation: vertex     Fetal Exam Fetal State Assessment: Category I - tracings are normal.     Physical Exam  Nursing note and vitals  reviewed. Constitutional: She appears well-developed and well-nourished.  HENT:  Head: Normocephalic.  Eyes: Pupils are equal, round, and reactive to light.  Neck: Normal range of motion.  Cardiovascular: Normal rate and regular rhythm.    Prenatal labs: ABO, Rh: A/Positive/-- (03/18 0000) Antibody: n (03/18 0000) Rubella: Immune (03/18 0000) RPR: Nonreactive (03/18 0000)  HBsAg: Negative (03/18 0000)  HIV: Non-reactive (03/18 0000)  GBS: Positive (03/18 0000)   Assessment/Plan: IUP at 39 Weeks Previous C Section and desires permanent sterilization Repeat LTCS  BTL   Elier Zellars L 01/26/2018, 6:47 PM

## 2018-01-28 NOTE — Patient Instructions (Signed)
Colleen Dalton  01/28/2018   Your procedure is scheduled on:  02/03/2018  Enter through the Main Entrance of [facility] at 1100 AM.  Pick up the phone at the desk and dial [phone]  Call this number if you have problems the morning of surgery:[phone]  Remember:   Do not eat food:(After Midnight) Desps de medianoche.  Do not drink clear liquids: (After Midnight) Desps de medianoche.  Take these medicines the morning of surgery with A SIP OF WATER: none   Do not wear jewelry, make-up or nail polish.  Do not wear lotions, powders, or perfumes. Do not wear deodorant.  Do not shave 48 hours prior to surgery.  Do not bring valuables to the hospital.  [facility] is not   responsible for any belongings or valuables brought to the hospital.  Contacts, dentures or bridgework may not be worn into surgery.  Leave suitcase in the car. After surgery it may be brought to your room.  For patients admitted to the hospital, checkout time is 11:00 AM the day of              discharge.    N/A   Please read over the following fact sheets that you were given:   Surgical Site Infection Prevention

## 2018-02-02 ENCOUNTER — Encounter (HOSPITAL_COMMUNITY)
Admission: RE | Admit: 2018-02-02 | Discharge: 2018-02-02 | Disposition: A | Discharge status: Home / Self Care | Payer: BLUE CROSS/BLUE SHIELD | Source: Ambulatory Visit | Attending: Obstetrics and Gynecology | Admitting: Obstetrics and Gynecology

## 2018-02-02 DIAGNOSIS — O34211 Maternal care for low transverse scar from previous cesarean delivery: Secondary | ICD-10-CM | POA: Diagnosis not present

## 2018-02-02 DIAGNOSIS — Z3A39 39 weeks gestation of pregnancy: Secondary | ICD-10-CM | POA: Diagnosis not present

## 2018-02-02 DIAGNOSIS — Z886 Allergy status to analgesic agent status: Secondary | ICD-10-CM | POA: Diagnosis not present

## 2018-02-02 DIAGNOSIS — Z87891 Personal history of nicotine dependence: Secondary | ICD-10-CM | POA: Diagnosis not present

## 2018-02-02 DIAGNOSIS — Z302 Encounter for sterilization: Secondary | ICD-10-CM | POA: Diagnosis not present

## 2018-02-02 LAB — CBC
HCT: 35.5 % — ABNORMAL LOW (ref 36.0–46.0)
Hemoglobin: 12.2 g/dL (ref 12.0–15.0)
MCH: 31.4 pg (ref 26.0–34.0)
MCHC: 34.4 g/dL (ref 30.0–36.0)
MCV: 91.5 fL (ref 80.0–100.0)
Platelets: 236 10*3/uL (ref 150–400)
RBC: 3.88 MIL/uL (ref 3.87–5.11)
RDW: 13 % (ref 11.5–15.5)
WBC: 9.8 10*3/uL (ref 4.0–10.5)
nRBC: 0 % (ref 0.0–0.2)

## 2018-02-02 LAB — TYPE AND SCREEN
ABO/RH(D): A POS
Antibody Screen: NEGATIVE

## 2018-02-02 LAB — ABO/RH: ABO/RH(D): A POS

## 2018-02-03 ENCOUNTER — Inpatient Hospital Stay (HOSPITAL_COMMUNITY)
Admission: RE | Admit: 2018-02-03 | Discharge: 2018-02-05 | DRG: 785 | Disposition: A | Discharge status: Home / Self Care | Payer: BLUE CROSS/BLUE SHIELD | Attending: Obstetrics and Gynecology | Admitting: Obstetrics and Gynecology

## 2018-02-03 ENCOUNTER — Encounter (HOSPITAL_COMMUNITY): Payer: Self-pay

## 2018-02-03 ENCOUNTER — Inpatient Hospital Stay (HOSPITAL_COMMUNITY): Payer: BLUE CROSS/BLUE SHIELD | Admitting: Anesthesiology

## 2018-02-03 ENCOUNTER — Encounter (HOSPITAL_COMMUNITY)
Admission: RE | Disposition: A | Discharge status: Home / Self Care | Payer: Self-pay | Source: Home / Self Care | Attending: Obstetrics and Gynecology

## 2018-02-03 DIAGNOSIS — Z3A39 39 weeks gestation of pregnancy: Secondary | ICD-10-CM | POA: Diagnosis not present

## 2018-02-03 DIAGNOSIS — Z98891 History of uterine scar from previous surgery: Secondary | ICD-10-CM

## 2018-02-03 DIAGNOSIS — Z886 Allergy status to analgesic agent status: Secondary | ICD-10-CM | POA: Diagnosis not present

## 2018-02-03 DIAGNOSIS — O34211 Maternal care for low transverse scar from previous cesarean delivery: Principal | ICD-10-CM | POA: Diagnosis present

## 2018-02-03 DIAGNOSIS — Z87891 Personal history of nicotine dependence: Secondary | ICD-10-CM

## 2018-02-03 DIAGNOSIS — Z302 Encounter for sterilization: Secondary | ICD-10-CM | POA: Diagnosis not present

## 2018-02-03 LAB — RPR: RPR Ser Ql: NONREACTIVE

## 2018-02-03 SURGERY — Surgical Case
Anesthesia: Spinal | Laterality: Bilateral

## 2018-02-03 MED ORDER — NALOXONE HCL 4 MG/10ML IJ SOLN: 1.0000 ug/kg/h | INTRAVENOUS | Status: DC | PRN

## 2018-02-03 MED ORDER — ACETAMINOPHEN 325 MG PO TABS: 650.0000 mg | ORAL_TABLET | ORAL | Status: DC | PRN

## 2018-02-03 MED ORDER — FENTANYL CITRATE (PF) 100 MCG/2ML IJ SOLN
INTRAMUSCULAR | Status: AC
  Filled 2018-02-03: qty 2

## 2018-02-03 MED ORDER — KETOROLAC TROMETHAMINE 30 MG/ML IJ SOLN: 30.0000 mg | Freq: Four times a day (QID) | INTRAMUSCULAR | Status: AC | PRN

## 2018-02-03 MED ORDER — NALBUPHINE HCL 10 MG/ML IJ SOLN
5.0000 mg | INTRAMUSCULAR | Status: DC | PRN
  Filled 2018-02-03: qty 1

## 2018-02-03 MED ORDER — PHENYLEPHRINE 8 MG IN D5W 100 ML (0.08MG/ML) PREMIX OPTIME
INJECTION | INTRAVENOUS | Status: DC | PRN
  Administered 2018-02-03: 60 ug/min via INTRAVENOUS

## 2018-02-03 MED ORDER — SCOPOLAMINE 1 MG/3DAYS TD PT72
MEDICATED_PATCH | TRANSDERMAL | Status: DC | PRN
  Administered 2018-02-03: 1 via TRANSDERMAL

## 2018-02-03 MED ORDER — OXYTOCIN 10 UNIT/ML IJ SOLN
INTRAMUSCULAR | Status: AC
  Filled 2018-02-03: qty 4

## 2018-02-03 MED ORDER — OXYTOCIN 40 UNITS IN LACTATED RINGERS INFUSION - SIMPLE MED: 2.5000 [IU]/h | INTRAVENOUS | Status: AC

## 2018-02-03 MED ORDER — IBUPROFEN 600 MG PO TABS
600.0000 mg | ORAL_TABLET | Freq: Four times a day (QID) | ORAL | Status: DC
  Administered 2018-02-03 – 2018-02-05 (×6): 600 mg via ORAL
  Filled 2018-02-03 (×6): qty 1

## 2018-02-03 MED ORDER — SCOPOLAMINE 1 MG/3DAYS TD PT72
MEDICATED_PATCH | TRANSDERMAL | Status: AC
  Filled 2018-02-03: qty 1

## 2018-02-03 MED ORDER — FENTANYL CITRATE (PF) 100 MCG/2ML IJ SOLN
INTRAMUSCULAR | Status: DC | PRN
  Administered 2018-02-03: 15 ug via INTRATHECAL

## 2018-02-03 MED ORDER — SIMETHICONE 80 MG PO CHEW
80.0000 mg | CHEWABLE_TABLET | Freq: Three times a day (TID) | ORAL | Status: DC
  Administered 2018-02-04 – 2018-02-05 (×4): 80 mg via ORAL
  Filled 2018-02-03 (×4): qty 1

## 2018-02-03 MED ORDER — SIMETHICONE 80 MG PO CHEW
80.0000 mg | CHEWABLE_TABLET | ORAL | Status: DC
  Administered 2018-02-03 – 2018-02-04 (×2): 80 mg via ORAL
  Filled 2018-02-03 (×2): qty 1

## 2018-02-03 MED ORDER — MORPHINE SULFATE (PF) 0.5 MG/ML IJ SOLN
INTRAMUSCULAR | Status: AC
  Filled 2018-02-03: qty 10

## 2018-02-03 MED ORDER — ONDANSETRON HCL 4 MG/2ML IJ SOLN
INTRAMUSCULAR | Status: AC
  Filled 2018-02-03: qty 2

## 2018-02-03 MED ORDER — COCONUT OIL OIL: 1.0000 "application " | TOPICAL_OIL | Status: DC | PRN

## 2018-02-03 MED ORDER — TETANUS-DIPHTH-ACELL PERTUSSIS 5-2.5-18.5 LF-MCG/0.5 IM SUSP: 0.5000 mL | Freq: Once | INTRAMUSCULAR | Status: DC

## 2018-02-03 MED ORDER — DIPHENHYDRAMINE HCL 25 MG PO CAPS: 25.0000 mg | ORAL_CAPSULE | ORAL | Status: DC | PRN

## 2018-02-03 MED ORDER — ONDANSETRON HCL 4 MG/2ML IJ SOLN: 4.0000 mg | Freq: Once | INTRAMUSCULAR | Status: DC | PRN

## 2018-02-03 MED ORDER — MEPERIDINE HCL 25 MG/ML IJ SOLN: 6.2500 mg | INTRAMUSCULAR | Status: DC | PRN

## 2018-02-03 MED ORDER — SODIUM CHLORIDE 0.9 % IR SOLN
Status: DC | PRN
  Administered 2018-02-03: 1

## 2018-02-03 MED ORDER — SCOPOLAMINE 1 MG/3DAYS TD PT72
1.0000 | MEDICATED_PATCH | Freq: Once | TRANSDERMAL | Status: DC
  Filled 2018-02-03: qty 1

## 2018-02-03 MED ORDER — LACTATED RINGERS IV SOLN
INTRAVENOUS | Status: DC
  Administered 2018-02-03: 23:00:00 via INTRAVENOUS

## 2018-02-03 MED ORDER — OXYCODONE-ACETAMINOPHEN 5-325 MG PO TABS: 2.0000 | ORAL_TABLET | ORAL | Status: DC | PRN

## 2018-02-03 MED ORDER — WITCH HAZEL-GLYCERIN EX PADS: 1.0000 "application " | MEDICATED_PAD | CUTANEOUS | Status: DC | PRN

## 2018-02-03 MED ORDER — FENTANYL CITRATE (PF) 100 MCG/2ML IJ SOLN: 25.0000 ug | INTRAMUSCULAR | Status: DC | PRN

## 2018-02-03 MED ORDER — NALBUPHINE HCL 10 MG/ML IJ SOLN: 5.0000 mg | Freq: Once | INTRAMUSCULAR | Status: AC | PRN

## 2018-02-03 MED ORDER — OXYCODONE-ACETAMINOPHEN 5-325 MG PO TABS: 1.0000 | ORAL_TABLET | ORAL | Status: DC | PRN

## 2018-02-03 MED ORDER — SODIUM CHLORIDE 0.9 % IV SOLN
2.0000 g | INTRAVENOUS | Status: AC
  Administered 2018-02-03: 2 g via INTRAVENOUS
  Filled 2018-02-03: qty 2

## 2018-02-03 MED ORDER — PRENATAL MULTIVITAMIN CH
1.0000 | ORAL_TABLET | Freq: Every day | ORAL | Status: DC
  Administered 2018-02-04: 1 via ORAL
  Filled 2018-02-03: qty 1

## 2018-02-03 MED ORDER — SODIUM CHLORIDE 0.9% FLUSH: 3.0000 mL | INTRAVENOUS | Status: DC | PRN

## 2018-02-03 MED ORDER — DIBUCAINE 1 % RE OINT: 1.0000 "application " | TOPICAL_OINTMENT | RECTAL | Status: DC | PRN

## 2018-02-03 MED ORDER — DIPHENHYDRAMINE HCL 50 MG/ML IJ SOLN: 12.5000 mg | INTRAMUSCULAR | Status: DC | PRN

## 2018-02-03 MED ORDER — DIPHENHYDRAMINE HCL 25 MG PO CAPS
25.0000 mg | ORAL_CAPSULE | Freq: Four times a day (QID) | ORAL | Status: DC | PRN
  Administered 2018-02-04: 25 mg via ORAL
  Filled 2018-02-03 (×2): qty 1

## 2018-02-03 MED ORDER — LACTATED RINGERS IV SOLN
INTRAVENOUS | Status: DC
  Administered 2018-02-03 (×2): via INTRAVENOUS

## 2018-02-03 MED ORDER — NALOXONE HCL 0.4 MG/ML IJ SOLN: 0.4000 mg | INTRAMUSCULAR | Status: DC | PRN

## 2018-02-03 MED ORDER — NALBUPHINE HCL 10 MG/ML IJ SOLN
5.0000 mg | INTRAMUSCULAR | Status: DC | PRN
  Administered 2018-02-04: 5 mg via SUBCUTANEOUS
  Filled 2018-02-03: qty 1

## 2018-02-03 MED ORDER — OXYTOCIN 10 UNIT/ML IJ SOLN
INTRAVENOUS | Status: DC | PRN
  Administered 2018-02-03: 40 [IU] via INTRAVENOUS

## 2018-02-03 MED ORDER — SIMETHICONE 80 MG PO CHEW: 80.0000 mg | CHEWABLE_TABLET | ORAL | Status: DC | PRN

## 2018-02-03 MED ORDER — ONDANSETRON HCL 4 MG/2ML IJ SOLN
INTRAMUSCULAR | Status: DC | PRN
  Administered 2018-02-03: 4 mg via INTRAVENOUS

## 2018-02-03 MED ORDER — KETOROLAC TROMETHAMINE 30 MG/ML IJ SOLN
30.0000 mg | Freq: Four times a day (QID) | INTRAMUSCULAR | Status: AC | PRN
  Administered 2018-02-03: 30 mg via INTRAVENOUS
  Filled 2018-02-03: qty 1

## 2018-02-03 MED ORDER — ONDANSETRON HCL 4 MG/2ML IJ SOLN
4.0000 mg | Freq: Three times a day (TID) | INTRAMUSCULAR | Status: DC | PRN
  Administered 2018-02-03: 4 mg via INTRAVENOUS
  Filled 2018-02-03: qty 2

## 2018-02-03 MED ORDER — NALBUPHINE HCL 10 MG/ML IJ SOLN
5.0000 mg | Freq: Once | INTRAMUSCULAR | Status: AC | PRN
  Administered 2018-02-03: 5 mg via INTRAVENOUS

## 2018-02-03 MED ORDER — MORPHINE SULFATE (PF) 0.5 MG/ML IJ SOLN
INTRAMUSCULAR | Status: DC | PRN
  Administered 2018-02-03: .15 mg via INTRATHECAL

## 2018-02-03 MED ORDER — ZOLPIDEM TARTRATE 5 MG PO TABS: 5.0000 mg | ORAL_TABLET | Freq: Every evening | ORAL | Status: DC | PRN

## 2018-02-03 MED ORDER — MENTHOL 3 MG MT LOZG: 1.0000 | LOZENGE | OROMUCOSAL | Status: DC | PRN

## 2018-02-03 MED ORDER — SENNOSIDES-DOCUSATE SODIUM 8.6-50 MG PO TABS
2.0000 | ORAL_TABLET | ORAL | Status: DC
  Administered 2018-02-03 – 2018-02-04 (×2): 2 via ORAL
  Filled 2018-02-03 (×2): qty 2

## 2018-02-03 MED ORDER — LACTATED RINGERS IV SOLN
INTRAVENOUS | Status: DC | PRN
  Administered 2018-02-03: 13:00:00 via INTRAVENOUS

## 2018-02-03 MED ORDER — PHENYLEPHRINE 8 MG IN D5W 100 ML (0.08MG/ML) PREMIX OPTIME
INJECTION | INTRAVENOUS | Status: AC
  Filled 2018-02-03: qty 100

## 2018-02-03 SURGICAL SUPPLY — 34 items
APL SKNCLS STERI-STRIP NONHPOA (GAUZE/BANDAGES/DRESSINGS) ×1
BARRIER ADHS 3X4 INTERCEED (GAUZE/BANDAGES/DRESSINGS) IMPLANT
BENZOIN TINCTURE PRP APPL 2/3 (GAUZE/BANDAGES/DRESSINGS) ×1 IMPLANT
BRR ADH 4X3 ABS CNTRL BYND (GAUZE/BANDAGES/DRESSINGS)
CLAMP CORD UMBIL (MISCELLANEOUS) IMPLANT
CLIP FILSHIE TUBAL LIGA STRL (Clip) ×1 IMPLANT
CLOTH BEACON ORANGE TIMEOUT ST (SAFETY) ×2 IMPLANT
DRSG OPSITE POSTOP 4X10 (GAUZE/BANDAGES/DRESSINGS) ×2 IMPLANT
ELECT REM PT RETURN 9FT ADLT (ELECTROSURGICAL) ×2
ELECTRODE REM PT RTRN 9FT ADLT (ELECTROSURGICAL) ×1 IMPLANT
EXTRACTOR VACUUM M CUP 4 TUBE (SUCTIONS) IMPLANT
GLOVE BIO SURGEON STRL SZ 6.5 (GLOVE) ×2 IMPLANT
GLOVE BIOGEL PI IND STRL 7.0 (GLOVE) ×1 IMPLANT
GLOVE BIOGEL PI INDICATOR 7.0 (GLOVE) ×1
GOWN STRL REUS W/TWL LRG LVL3 (GOWN DISPOSABLE) ×4 IMPLANT
KIT ABG SYR 3ML LUER SLIP (SYRINGE) IMPLANT
NDL HYPO 25X5/8 SAFETYGLIDE (NEEDLE) ×1 IMPLANT
NEEDLE HYPO 22GX1.5 SAFETY (NEEDLE) IMPLANT
NEEDLE HYPO 25X5/8 SAFETYGLIDE (NEEDLE) ×2 IMPLANT
NS IRRIG 1000ML POUR BTL (IV SOLUTION) ×2 IMPLANT
PACK C SECTION WH (CUSTOM PROCEDURE TRAY) ×2 IMPLANT
PAD OB MATERNITY 4.3X12.25 (PERSONAL CARE ITEMS) ×2 IMPLANT
PENCIL SMOKE EVAC W/HOLSTER (ELECTROSURGICAL) ×2 IMPLANT
SPONGE LAP 18X18 RF (DISPOSABLE) ×6 IMPLANT
STRIP CLOSURE SKIN 1/2X4 (GAUZE/BANDAGES/DRESSINGS) ×1 IMPLANT
SUT CHROMIC 0 CTX 36 (SUTURE) ×4 IMPLANT
SUT PLAIN 0 NONE (SUTURE) IMPLANT
SUT PLAIN 2 0 XLH (SUTURE) IMPLANT
SUT VIC AB 0 CT1 27 (SUTURE) ×6
SUT VIC AB 0 CT1 27XBRD ANBCTR (SUTURE) ×3 IMPLANT
SUT VIC AB 4-0 KS 27 (SUTURE) IMPLANT
SYR CONTROL 10ML LL (SYRINGE) IMPLANT
TOWEL OR 17X24 6PK STRL BLUE (TOWEL DISPOSABLE) ×2 IMPLANT
TRAY FOLEY W/BAG SLVR 14FR LF (SET/KITS/TRAYS/PACK) ×2 IMPLANT

## 2018-02-03 NOTE — Brief Op Note (Signed)
02/03/2018  1:51 PM  PATIENT:  Colleen Dalton  37 y.o. female  PRE-OPERATIVE DIAGNOSIS:   IUP at 39 weeks Previous Cesarean Section Desires Permanent sterilization  POST-OPERATIVE DIAGNOSIS:   Same  PROCEDURE:  Procedure(s) with comments: REPEAT CESAREAN SECTION WITH BILATERAL TUBAL LIGATION (Bilateral) - REPEAT EDC 02/10/18 ALLERGY TO Forest Becker RNFA  SURGEON:  Surgeon(s) and Role:    * Marcelle Overlie, MD - Primary  PHYSICIAN ASSISTANT:   ASSISTANTS: none   ANESTHESIA:   spinal  EBL:  500   BLOOD ADMINISTERED:none  DRAINS: Urinary Catheter (Foley)   LOCAL MEDICATIONS USED:  NONE  SPECIMEN:  No Specimen  DISPOSITION OF SPECIMEN:  N/A  COUNTS:  YES  TOURNIQUET:  * No tourniquets in log *  DICTATION: .Other Dictation: Dictation Number dictated  PLAN OF CARE: Admit to inpatient   PATIENT DISPOSITION:  PACU - hemodynamically stable.   Delay start of Pharmacological VTE agent (>24hrs) due to surgical blood loss or risk of bleeding: not applicable

## 2018-02-03 NOTE — Anesthesia Postprocedure Evaluation (Signed)
Anesthesia Post Note  Patient: Colleen Dalton  Procedure(s) Performed: REPEAT CESAREAN SECTION WITH BILATERAL TUBAL LIGATION (Bilateral )     Patient location during evaluation: PACU Anesthesia Type: Spinal Level of consciousness: oriented and awake and alert Pain management: pain level controlled Vital Signs Assessment: post-procedure vital signs reviewed and stable Respiratory status: spontaneous breathing, respiratory function stable and patient connected to nasal cannula oxygen Cardiovascular status: blood pressure returned to baseline and stable Postop Assessment: no headache, no backache and no apparent nausea or vomiting Anesthetic complications: no    Last Vitals:  Vitals:   02/03/18 1525 02/03/18 1640  BP: 113/70 117/76  Pulse: 60 (!) 57  Resp: 20 18  Temp: 36.4 C (!) 36.3 C  SpO2: 100% 99%    Last Pain:  Vitals:   02/03/18 1640  TempSrc: Oral  PainSc: 5    Pain Goal: Patients Stated Pain Goal: 10 (02/03/18 1125)               Fountain Derusha

## 2018-02-03 NOTE — Progress Notes (Signed)
H and p on the chart No changes Will proceed with Repeat LTCS and BTL Consent signed

## 2018-02-03 NOTE — Progress Notes (Signed)
Notified Dr. Tacy Dura of no anesthesia post-op orders.  He was also notified of patient's nausea and vomiting and he stated RN could give another dose of zofran.  Dore Oquin, [place]

## 2018-02-03 NOTE — Transfer of Care (Signed)
Immediate Anesthesia Transfer of Care Note  Patient: Colleen Dalton  Procedure(s) Performed: REPEAT CESAREAN SECTION WITH BILATERAL TUBAL LIGATION (Bilateral )  Patient Location: PACU  Anesthesia Type:Spinal  Level of Consciousness: awake, alert  and oriented  Airway & Oxygen Therapy: Patient Spontanous Breathing  Post-op Assessment: Report given to RN and Post -op Vital signs reviewed and stable  Post vital signs: Reviewed and stable  Last Vitals:  Vitals Value Taken Time  BP    Temp    Pulse 87 02/03/2018  2:06 PM  Resp 20 02/03/2018  2:06 PM  SpO2 99 % 02/03/2018  2:06 PM  Vitals shown include unvalidated device data.  Last Pain:  Vitals:   02/03/18 1125  TempSrc:   PainSc: 0-No pain      Patients Stated Pain Goal: 10 (02/03/18 1125)  Complications: No apparent anesthesia complications

## 2018-02-03 NOTE — Anesthesia Preprocedure Evaluation (Signed)
Anesthesia Evaluation  Patient identified by MRN, date of birth, ID band Patient awake    Reviewed: Allergy & Precautions, H&P , NPO status , Patient's Chart, lab work & pertinent test results, reviewed documented beta blocker date and time   Airway Mallampati: II  TM Distance: >3 FB Neck ROM: full    Dental no notable dental hx.    Pulmonary former smoker,    Pulmonary exam normal breath sounds clear to auscultation       Cardiovascular Exercise Tolerance: Good negative cardio ROS   Rhythm:regular Rate:Normal     Neuro/Psych Anxiety negative neurological ROS     GI/Hepatic negative GI ROS, Neg liver ROS,   Endo/Other  negative endocrine ROS  Renal/GU negative Renal ROS  negative genitourinary   Musculoskeletal negative musculoskeletal ROS (+)   Abdominal (+) + obese,   Peds  Hematology negative hematology ROS (+)   Anesthesia Other Findings   Reproductive/Obstetrics negative OB ROS                             Anesthesia Physical  Anesthesia Plan  ASA: II  Anesthesia Plan: Spinal   Post-op Pain Management:    Induction:   PONV Risk Score and Plan: 2 and Ondansetron, Treatment may vary due to age or medical condition and Scopolamine patch - Pre-op  Airway Management Planned: Nasal Cannula, Natural Airway and Mask  Additional Equipment:   Intra-op Plan:   Post-operative Plan:   Informed Consent: I have reviewed the patients History and Physical, chart, labs and discussed the procedure including the risks, benefits and alternatives for the proposed anesthesia with the patient or authorized representative who has indicated his/her understanding and acceptance.   Dental Advisory Given  Plan Discussed with: CRNA, Anesthesiologist and Surgeon  Anesthesia Plan Comments: (Lab work confirmed with CRNA in room. Platelets okay. Discussed spinal anesthetic, and patient consents to  the procedure:  included risk of possible headache,backache, failed block, allergic reaction, and nerve injury. This patient was asked if she had any questions or concerns before the procedure started. )        Anesthesia Quick Evaluation

## 2018-02-04 LAB — CBC
HCT: 34.1 % — ABNORMAL LOW (ref 36.0–46.0)
Hemoglobin: 11.7 g/dL — ABNORMAL LOW (ref 12.0–15.0)
MCH: 31.5 pg (ref 26.0–34.0)
MCHC: 34.3 g/dL (ref 30.0–36.0)
MCV: 91.9 fL (ref 80.0–100.0)
Platelets: 214 10*3/uL (ref 150–400)
RBC: 3.71 MIL/uL — ABNORMAL LOW (ref 3.87–5.11)
RDW: 13.1 % (ref 11.5–15.5)
WBC: 13.9 10*3/uL — ABNORMAL HIGH (ref 4.0–10.5)
nRBC: 0 % (ref 0.0–0.2)

## 2018-02-04 LAB — BIRTH TISSUE RECOVERY COLLECTION (PLACENTA DONATION)

## 2018-02-04 NOTE — Progress Notes (Signed)
Patient complaint of constant foley catheter burning where inserted. Assessed to have output WNL and ambulating well and in halls. Foley catheter d/c'd.

## 2018-02-04 NOTE — Progress Notes (Addendum)
Patient voided twice, but having to go multiple times for complete emptying of bladder. New bladder scanner used, unable to detect bladder due to dressing. Dr. Langston Masker contacted this issue and burning with foley & wants RN to advise patient to void q2hrs and keep IV for now.

## 2018-02-04 NOTE — Op Note (Signed)
NAME: Colleen Dalton, [place] MEDICAL RECORD ZO:[id number] ACCOUNT [id number] DATE OF BIRTH:May 25, 1980 FACILITY: [facility] LOCATION: [id number] PHYSICIAN:Makensey Rego Rosita Fire, MD  OPERATIVE REPORT  DATE OF PROCEDURE:  02/03/2018  PREOPERATIVE DIAGNOSES:  Intrauterine pregnancy at 39 weeks, previous cesarean section, and desires permanent sterilization.  POSTOPERATIVE DIAGNOSES:  Intrauterine pregnancy at 39 weeks, previous cesarean section, and desires permanent sterilization.  PROCEDURE:  Repeat low transverse cesarean section and bilateral tubal ligation with placement of Filshie clips.  SURGEON:  Marcelle Overlie, MD  ESTIMATED BLOOD LOSS:  Less than 500.  ANESTHESIA:  Spinal.  DRAINS:  Foley.  DESCRIPTION OF PROCEDURE:  The patient was taken to the operating room.  She was prepped and draped in the usual sterile fashion.  A Foley catheter was inserted and a timeout was performed.  She was prepped and draped, and a low transverse incision was  made, carried down to the fascia.  Fascia was scored in the midline and extended laterally.  The rectus muscles were separated in the midline.  The peritoneum was entered bluntly.  The peritoneal incision was then stretched.  The bladder blade was  inserted.  The lower uterine segment was identified, and the bladder flap was created sharply and digitally.  The bladder blade was then readjusted.  A low transverse incision was made in the uterus.  The uterus was entered using hemostat.  Amniotic  fluid was clear.  The baby was in cephalic presentation and was delivered easily.  It was a female infant, Apgars 9 at one minute and 9 at five minutes.  The cord was clamped and cut.  The baby was handed to the neonatal team.  The uterus was cleared of  all clots and debris, including the placenta was manually removed and noted to be intact with a 3-vessel cord.  The uterus was exteriorized, and it was closed in 1 layer using 0 chromic in a running locked stitch.   Hemostasis was very good.  We then  performed a bilateral tubal ligation by identifying each fallopian tube and placing a Filshie clip across the midportion of each tube.  The uterus was returned to the abdomen.  Irrigation was performed.  The peritoneum was closed using 0 Vicryl.  The  fascia was closed using 0 Vicryl starting in a running stitch.  The skin was closed in subcuticular using 3-0 on a Keith needle.  Steri-Strips and a honeycomb dressing were applied.  The patient tolerated the procedure well.  All sponge, lap, and  instrument counts were correct x2.  LN/NUANCE  D:02/04/2018 T:02/04/2018 JOB:[id number]

## 2018-02-04 NOTE — Progress Notes (Signed)
Subjective: Postpartum Day 1: Cesarean Delivery Patient reports tolerating PO.    Objective: Vital signs in last 24 hours: Temp:  [96.7 F (35.9 C)-98.3 F (36.8 C)] 98.2 F (36.8 C) (10/17 0628) Pulse Rate:  [57-87] 61 (10/17 0628) Resp:  [15-25] 18 (10/17 0628) BP: (97-132)/(38-86) 97/60 (10/17 0628) SpO2:  [95 %-100 %] 99 % (10/17 0628) Weight:  [88.1 kg] 88.1 kg (10/16 1107)  Physical Exam:  General: alert Lochia: appropriate Uterine Fundus: firm Incision: healing well DVT Evaluation: No evidence of DVT seen on physical exam.  Recent Labs    02/02/18 0917 02/04/18 0531  HGB 12.2 11.7*  HCT 35.5* 34.1*    Assessment/Plan: Status post Cesarean section. Doing well postoperatively.  Continue current care.  Colleen Dalton Colleen Dalton 02/04/2018, 7:58 AM

## 2018-02-04 NOTE — Lactation Note (Signed)
This note was copied from a baby's chart. Lactation Consultation Note  Patient Name: Colleen Dalton Date: 02/04/2018 Reason for consult: Initial assessment;Term;Infant weight loss P2, 17 hour female infant  Per parents, infant had 4 soiled (meconium) and 3 wet diapers. Per mom, previously BF son almost one year. Per mom, history of oversupply. Mom had previously BF prior LC entering room. Dad was giving infant EBM in bottle 15 ml. Per mom, pump 15 ml using harmony manual pump due filling full.  Infant last latch was good per parents. LC did not observe latch. LC discussed infant feeding cues, BF 8 to 12 times within 24 hours including nights. LC discussed I & O. Reviewed Baby & Me book's Breastfeeding Basics.  If mom has any other questions or concerns or need assistance with latch mom will call nurse or LC.  Mom made aware of O/P services, breastfeeding support groups, community resources, and our phone # for post-discharge questions.   Maternal Data Formula Feeding for Exclusion: No Has patient been taught Hand Expression?: Yes Does the patient have breastfeeding experience prior to this delivery?: Yes  Feeding Feeding Type: Breast Fed  LATCH Score                   Interventions Interventions: Breast feeding basics reviewed  Lactation Tools Discussed/Used WIC Program: No   Consult Status Consult Status: Follow-up Date: 02/04/18 Follow-up type: In-patient    Colleen Dalton 02/04/2018, 7:07 AM

## 2018-02-04 NOTE — Progress Notes (Signed)
MOB was referred for history of depression/anxiety. * Referral screened out by Clinical Social Worker because none of the following criteria appear to apply: ~ History of anxiety/depression during this pregnancy, or of post-partum depression following prior delivery. ~ Diagnosis of anxiety and/or depression within last 3 years OR * MOB's symptoms currently being treated with medication and/or therapy. Please contact the Clinical Social Worker if needs arise, by MOB request, or if MOB scores greater than 9/yes to question 10 on Edinburgh Postpartum Depression Screen.  Frans Valente, LCSW Clinical Social Worker  [facility]  [phone]  

## 2018-02-04 NOTE — Lactation Note (Signed)
This note was copied from a baby's chart. Lactation Consultation Note  Patient Name: Colleen Dalton [id number] Date: 02/04/2018 Reason for consult: Follow-up assessment;Term;Infant weight loss Baby is 62  Hours old and per mom baby last fed at 1615 for 20 mins.  Wet and stool.  Per mom feels breast feeding is going well.  Per mom expressed milk earlier - LC recommended calling for assistance  Is needing help with syringe feeding / spoon feeding.  Mother informed of post-discharge support and given phone number to the lactation department, including services for phone call assistance; out-patient appointments; and breastfeeding support group. List of other breastfeeding resources in the community given in the handout. Encouraged mother to call for problems or concerns related to breastfeeding.  Maternal Data    Feeding Feeding Type: (per mom last fed at 1615 )  LATCH Score                   Interventions Interventions: Breast feeding basics reviewed  Lactation Tools Discussed/Used Tools: Pump Breast pump type: Manual   Consult Status Consult Status: Follow-up Date: 02/05/18 Follow-up type: In-patient    Colleen Dalton 02/04/2018, 6:46 PM

## 2018-02-05 MED ORDER — OXYCODONE HCL 5 MG PO TABS: 5.0000 mg | ORAL_TABLET | ORAL | 0 refills | Status: AC | PRN

## 2018-02-05 MED ORDER — IBUPROFEN 600 MG PO TABS: 600.0000 mg | ORAL_TABLET | Freq: Four times a day (QID) | ORAL | 0 refills | Status: AC

## 2018-02-05 MED ORDER — DOCUSATE SODIUM 100 MG PO CAPS: 100.0000 mg | ORAL_CAPSULE | Freq: Two times a day (BID) | ORAL | 2 refills | Status: AC

## 2018-02-05 NOTE — Discharge Summary (Signed)
Obstetric Discharge Summary Reason for Admission: cesarean section and BTL (scheduled repeat) Prenatal Procedures: none Intrapartum Procedures: cesarean: low cervical, transverse and tubal ligation Postpartum Procedures: none Complications-Operative and Postpartum: none Hemoglobin  Date Value Ref Range Status  02/04/2018 11.7 (L) 12.0 - 15.0 g/dL Final   HCT  Date Value Ref Range Status  02/04/2018 34.1 (L) 36.0 - 46.0 % Final    Physical Exam:  General: alert, cooperative and appears stated age 37: appropriate Uterine Fundus: firm Incision: healing well, no significant drainage, no dehiscence, no significant erythema DVT Evaluation: No evidence of DVT seen on physical exam. Negative Homan's sign. No cords or calf tenderness. No significant calf/ankle edema.  Discharge Diagnoses: Term Pregnancy-delivered  Discharge Information: Date: 02/05/2018 Activity: pelvic rest Diet: routine Medications: PNV, Ibuprofen, Colace and oxycodone Condition: stable Instructions: refer to practice specific booklet Discharge to: home   Newborn Data: Live born female  Birth Weight: 7 lb 10.9 oz (3485 g) APGAR: 8, 9  Newborn Delivery   Birth date/time:  02/03/2018 13:25:00 Delivery type:  C-Section, Low Transverse Trial of labor:  No C-section categorization:  Repeat     Home with mother.  Ranae Pila 02/05/2018, 8:57 AM

## 2018-03-15 DIAGNOSIS — Z1389 Encounter for screening for other disorder: Secondary | ICD-10-CM | POA: Diagnosis not present

## 2018-03-24 DIAGNOSIS — D2272 Melanocytic nevi of left lower limb, including hip: Secondary | ICD-10-CM | POA: Diagnosis not present

## 2018-03-24 DIAGNOSIS — D485 Neoplasm of uncertain behavior of skin: Secondary | ICD-10-CM | POA: Diagnosis not present

## 2018-05-21 DIAGNOSIS — J019 Acute sinusitis, unspecified: Secondary | ICD-10-CM | POA: Diagnosis not present

## 2018-09-07 DIAGNOSIS — Z03818 Encounter for observation for suspected exposure to other biological agents ruled out: Secondary | ICD-10-CM | POA: Diagnosis not present

## 2018-09-24 DIAGNOSIS — F419 Anxiety disorder, unspecified: Secondary | ICD-10-CM | POA: Diagnosis not present

## 2018-09-24 DIAGNOSIS — F900 Attention-deficit hyperactivity disorder, predominantly inattentive type: Secondary | ICD-10-CM | POA: Diagnosis not present

## 2018-10-25 DIAGNOSIS — Z20828 Contact with and (suspected) exposure to other viral communicable diseases: Secondary | ICD-10-CM | POA: Diagnosis not present

## 2018-10-25 DIAGNOSIS — Z7189 Other specified counseling: Secondary | ICD-10-CM | POA: Diagnosis not present

## 2018-12-22 DIAGNOSIS — D225 Melanocytic nevi of trunk: Secondary | ICD-10-CM | POA: Diagnosis not present

## 2018-12-22 DIAGNOSIS — D224 Melanocytic nevi of scalp and neck: Secondary | ICD-10-CM | POA: Diagnosis not present

## 2018-12-22 DIAGNOSIS — L905 Scar conditions and fibrosis of skin: Secondary | ICD-10-CM | POA: Diagnosis not present

## 2018-12-22 DIAGNOSIS — L918 Other hypertrophic disorders of the skin: Secondary | ICD-10-CM | POA: Diagnosis not present

## 2019-03-11 DIAGNOSIS — J3489 Other specified disorders of nose and nasal sinuses: Secondary | ICD-10-CM | POA: Diagnosis not present

## 2019-03-11 DIAGNOSIS — Z20828 Contact with and (suspected) exposure to other viral communicable diseases: Secondary | ICD-10-CM | POA: Diagnosis not present

## 2019-03-28 DIAGNOSIS — E7800 Pure hypercholesterolemia, unspecified: Secondary | ICD-10-CM | POA: Diagnosis not present

## 2019-03-28 DIAGNOSIS — Z0000 Encounter for general adult medical examination without abnormal findings: Secondary | ICD-10-CM | POA: Diagnosis not present

## 2019-03-30 DIAGNOSIS — Z0000 Encounter for general adult medical examination without abnormal findings: Secondary | ICD-10-CM | POA: Diagnosis not present

## 2019-03-31 DIAGNOSIS — Z01419 Encounter for gynecological examination (general) (routine) without abnormal findings: Secondary | ICD-10-CM | POA: Diagnosis not present

## 2019-03-31 DIAGNOSIS — Z6824 Body mass index (BMI) 24.0-24.9, adult: Secondary | ICD-10-CM | POA: Diagnosis not present

## 2019-04-13 ENCOUNTER — Ambulatory Visit: Payer: BC Managed Care – PPO | Attending: Internal Medicine

## 2019-04-13 DIAGNOSIS — U071 COVID-19: Secondary | ICD-10-CM

## 2019-04-13 DIAGNOSIS — R238 Other skin changes: Secondary | ICD-10-CM

## 2019-04-14 LAB — NOVEL CORONAVIRUS, NAA: SARS-CoV-2, NAA: NOT DETECTED

## 2019-06-29 DIAGNOSIS — R42 Dizziness and giddiness: Secondary | ICD-10-CM | POA: Diagnosis not present

## 2019-10-04 DIAGNOSIS — F900 Attention-deficit hyperactivity disorder, predominantly inattentive type: Secondary | ICD-10-CM | POA: Diagnosis not present

## 2019-10-04 DIAGNOSIS — F419 Anxiety disorder, unspecified: Secondary | ICD-10-CM | POA: Diagnosis not present

## 2019-10-07 IMAGING — RF DG HYSTEROGRAM
5 series · 5 of 5 positions shown · non-contrast
Comparison: None.

CLINICAL DATA: Fertility testing.

EXAM:
HYSTEROSALPINGOGRAM
TECHNIQUE: Hysterosalpingogram was performed by the ordering physician under
fluoroscopy. Fluoroscopic images were submitted for radiologic
interpretation following the procedure. Please see the procedural
report for the amount of contrast and the fluoroscopy time utilized.

[Series 1: run · 1 of 1 slices shown (1 of 5)]
[im 1/1]
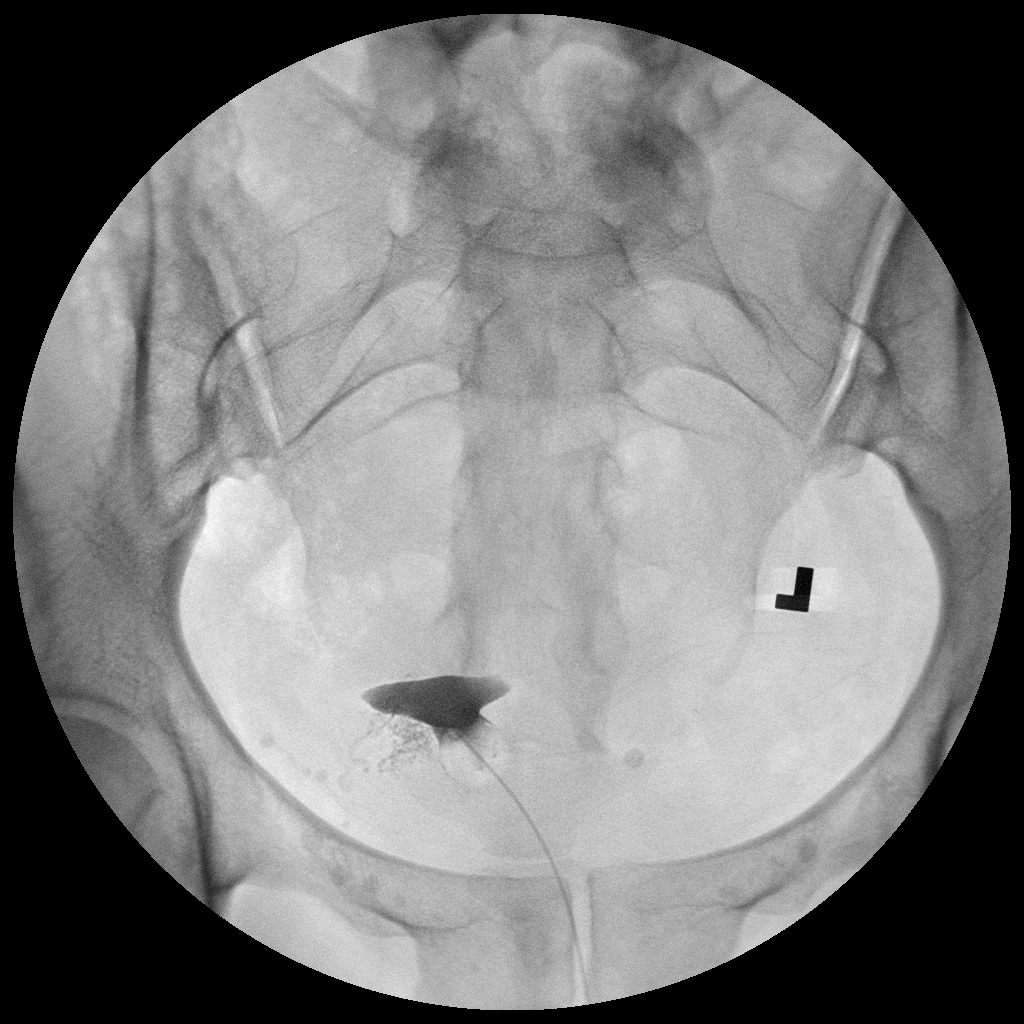

[Series 2: run · 1 of 1 slices shown (2 of 5)]
[im 1/1]
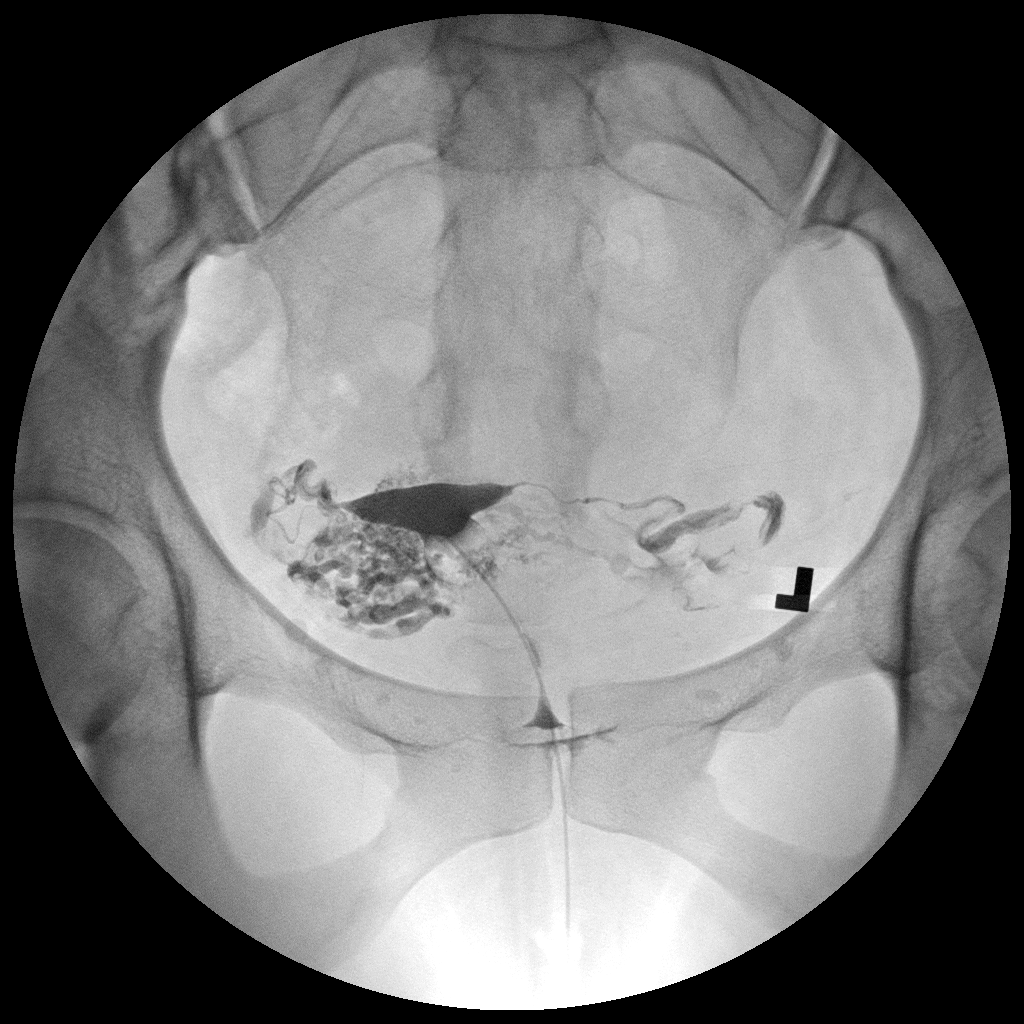

[Series 3: run · 1 of 1 slices shown (3 of 5)]
[im 1/1]
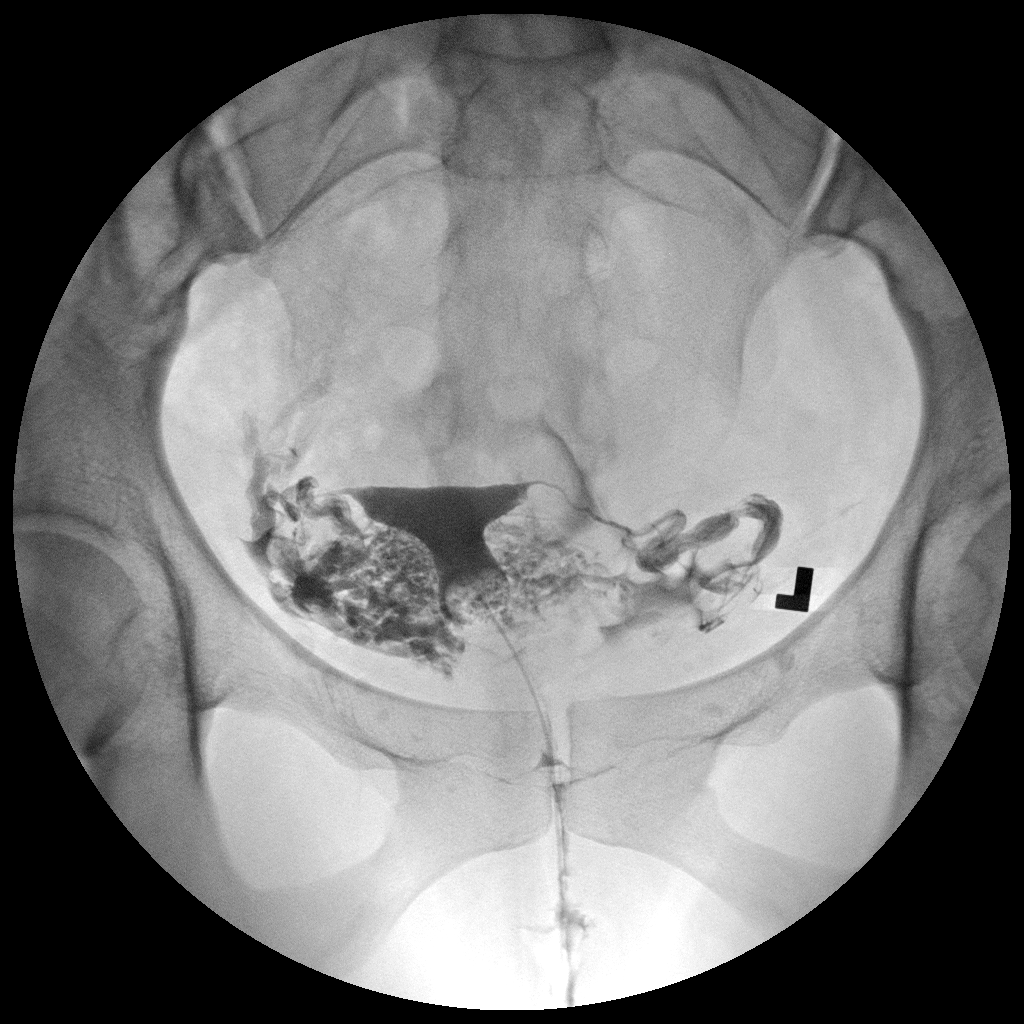

[Series 4: run · 1 of 1 slices shown (4 of 5)]
[im 1/1]
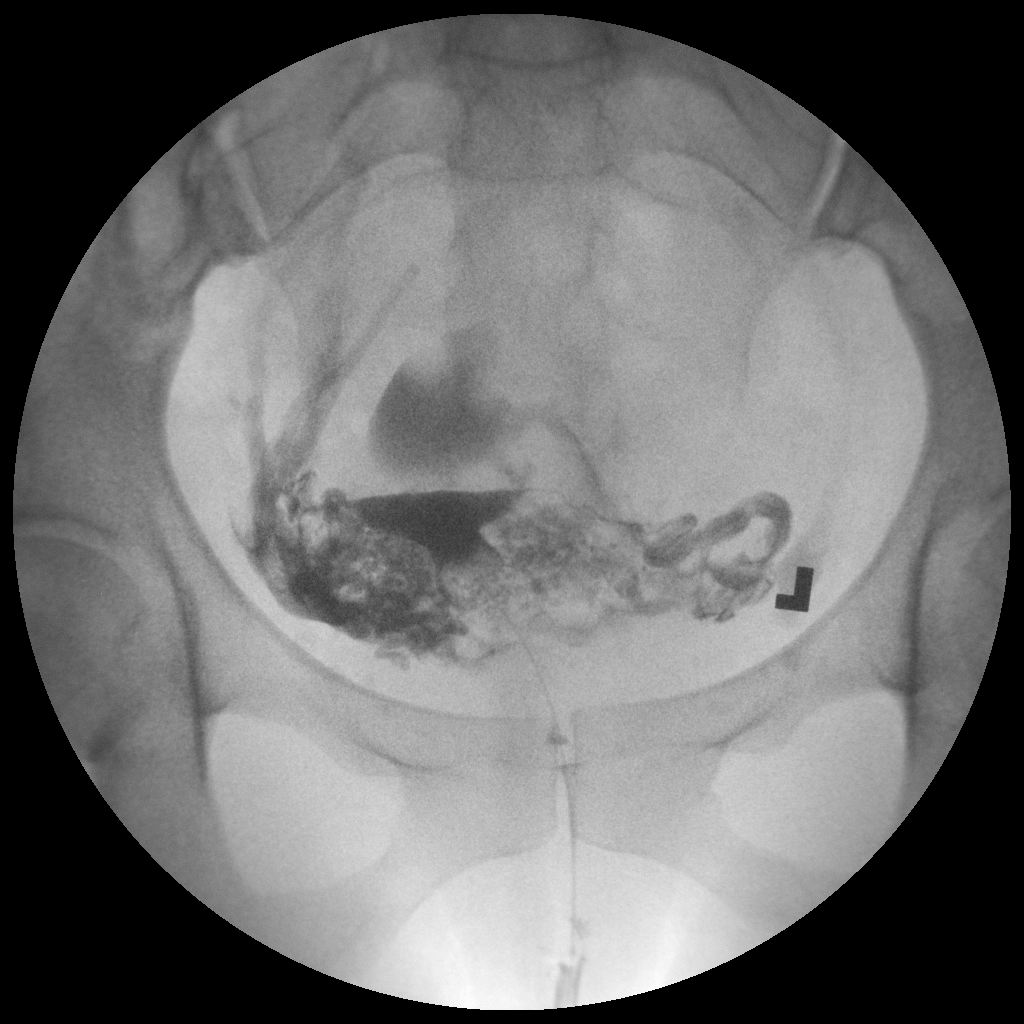

[Series 5: run · 1 of 1 slices shown (5 of 5)]
[im 1/1]
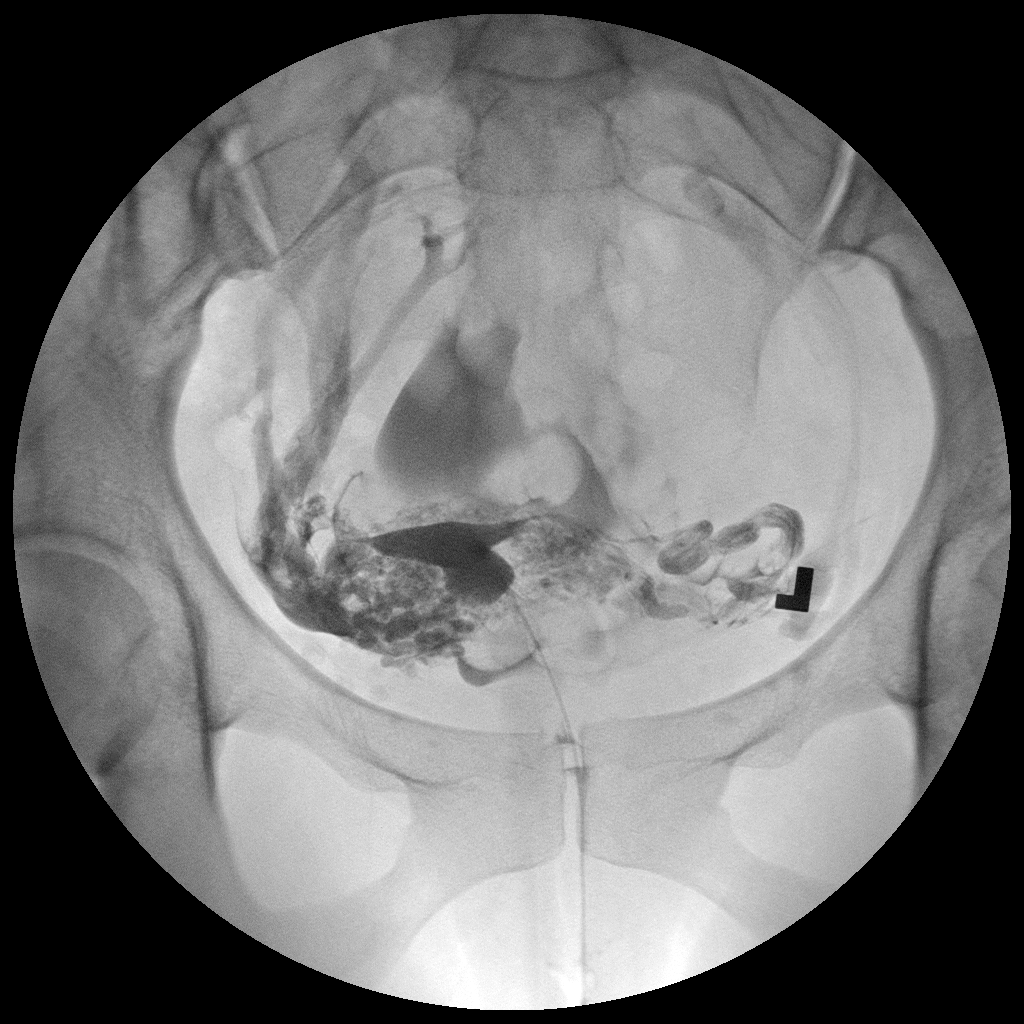

[5 of 5 positions shown; findings below may reference images not displayed]

FINDINGS: Endometrial Cavity: Normal appearance. No signs of Mullerian duct
anomaly or other significant abnormality.

Right Fallopian Tube: Normal appearance. Free intraperitoneal spill
of contrast is demonstrated.

Left Fallopian Tube: Normal appearance. Free intraperitoneal spill
of contrast is demonstrated.

Other:  Lymphovenous intravasation of contrast noted.
IMPRESSION: Both fallopian tubes are patent.

## 2019-12-09 DIAGNOSIS — Z0000 Encounter for general adult medical examination without abnormal findings: Secondary | ICD-10-CM | POA: Diagnosis not present

## 2019-12-15 DIAGNOSIS — O039 Complete or unspecified spontaneous abortion without complication: Secondary | ICD-10-CM | POA: Diagnosis not present

## 2019-12-15 DIAGNOSIS — Z6823 Body mass index (BMI) 23.0-23.9, adult: Secondary | ICD-10-CM | POA: Diagnosis not present

## 2019-12-15 DIAGNOSIS — Z832 Family history of diseases of the blood and blood-forming organs and certain disorders involving the immune mechanism: Secondary | ICD-10-CM | POA: Diagnosis not present

## 2019-12-21 ENCOUNTER — Other Ambulatory Visit: Payer: Self-pay | Admitting: Obstetrics and Gynecology

## 2019-12-21 DIAGNOSIS — N644 Mastodynia: Secondary | ICD-10-CM

## 2019-12-21 DIAGNOSIS — N6019 Diffuse cystic mastopathy of unspecified breast: Secondary | ICD-10-CM | POA: Diagnosis not present

## 2019-12-21 DIAGNOSIS — Z23 Encounter for immunization: Secondary | ICD-10-CM | POA: Diagnosis not present

## 2020-01-10 ENCOUNTER — Other Ambulatory Visit: Payer: Self-pay

## 2020-01-10 ENCOUNTER — Ambulatory Visit
Admission: RE | Admit: 2020-01-10 | Discharge: 2020-01-10 | Disposition: A | Discharge status: Home / Self Care | Payer: BC Managed Care – PPO | Source: Ambulatory Visit | Attending: Obstetrics and Gynecology | Admitting: Obstetrics and Gynecology

## 2020-01-10 DIAGNOSIS — N644 Mastodynia: Secondary | ICD-10-CM

## 2020-01-10 DIAGNOSIS — R922 Inconclusive mammogram: Secondary | ICD-10-CM | POA: Diagnosis not present

## 2020-01-10 DIAGNOSIS — N6489 Other specified disorders of breast: Secondary | ICD-10-CM | POA: Diagnosis not present

## 2020-03-12 DIAGNOSIS — Z1159 Encounter for screening for other viral diseases: Secondary | ICD-10-CM | POA: Diagnosis not present

## 2020-03-23 DIAGNOSIS — Z1159 Encounter for screening for other viral diseases: Secondary | ICD-10-CM | POA: Diagnosis not present

## 2020-04-06 DIAGNOSIS — F900 Attention-deficit hyperactivity disorder, predominantly inattentive type: Secondary | ICD-10-CM | POA: Diagnosis not present

## 2020-04-06 DIAGNOSIS — F419 Anxiety disorder, unspecified: Secondary | ICD-10-CM | POA: Diagnosis not present

## 2020-04-29 DIAGNOSIS — Z1152 Encounter for screening for COVID-19: Secondary | ICD-10-CM | POA: Diagnosis not present

## 2020-05-08 DIAGNOSIS — Z1159 Encounter for screening for other viral diseases: Secondary | ICD-10-CM | POA: Diagnosis not present

## 2020-05-21 DIAGNOSIS — Z1159 Encounter for screening for other viral diseases: Secondary | ICD-10-CM | POA: Diagnosis not present

## 2020-05-31 DIAGNOSIS — Z01419 Encounter for gynecological examination (general) (routine) without abnormal findings: Secondary | ICD-10-CM | POA: Diagnosis not present

## 2020-05-31 DIAGNOSIS — Z6823 Body mass index (BMI) 23.0-23.9, adult: Secondary | ICD-10-CM | POA: Diagnosis not present

## 2020-05-31 DIAGNOSIS — L680 Hirsutism: Secondary | ICD-10-CM | POA: Diagnosis not present

## 2020-05-31 DIAGNOSIS — N926 Irregular menstruation, unspecified: Secondary | ICD-10-CM | POA: Diagnosis not present

## 2020-05-31 DIAGNOSIS — Z1329 Encounter for screening for other suspected endocrine disorder: Secondary | ICD-10-CM | POA: Diagnosis not present

## 2020-06-12 DIAGNOSIS — F900 Attention-deficit hyperactivity disorder, predominantly inattentive type: Secondary | ICD-10-CM | POA: Diagnosis not present

## 2020-07-02 DIAGNOSIS — Z1159 Encounter for screening for other viral diseases: Secondary | ICD-10-CM | POA: Diagnosis not present

## 2020-07-10 DIAGNOSIS — L818 Other specified disorders of pigmentation: Secondary | ICD-10-CM | POA: Diagnosis not present

## 2020-07-10 DIAGNOSIS — D224 Melanocytic nevi of scalp and neck: Secondary | ICD-10-CM | POA: Diagnosis not present

## 2020-07-10 DIAGNOSIS — L578 Other skin changes due to chronic exposure to nonionizing radiation: Secondary | ICD-10-CM | POA: Diagnosis not present

## 2020-07-10 DIAGNOSIS — L918 Other hypertrophic disorders of the skin: Secondary | ICD-10-CM | POA: Diagnosis not present

## 2020-08-02 DIAGNOSIS — F419 Anxiety disorder, unspecified: Secondary | ICD-10-CM | POA: Diagnosis not present

## 2020-08-02 DIAGNOSIS — F900 Attention-deficit hyperactivity disorder, predominantly inattentive type: Secondary | ICD-10-CM | POA: Diagnosis not present

## 2020-10-10 DIAGNOSIS — Z0000 Encounter for general adult medical examination without abnormal findings: Secondary | ICD-10-CM | POA: Diagnosis not present

## 2020-12-27 DIAGNOSIS — Z23 Encounter for immunization: Secondary | ICD-10-CM | POA: Diagnosis not present

## 2021-02-25 DIAGNOSIS — J019 Acute sinusitis, unspecified: Secondary | ICD-10-CM | POA: Diagnosis not present

## 2021-04-11 DIAGNOSIS — F900 Attention-deficit hyperactivity disorder, predominantly inattentive type: Secondary | ICD-10-CM | POA: Diagnosis not present

## 2021-04-11 DIAGNOSIS — F419 Anxiety disorder, unspecified: Secondary | ICD-10-CM | POA: Diagnosis not present

## 2021-06-10 DIAGNOSIS — Z6823 Body mass index (BMI) 23.0-23.9, adult: Secondary | ICD-10-CM | POA: Diagnosis not present

## 2021-06-10 DIAGNOSIS — Z124 Encounter for screening for malignant neoplasm of cervix: Secondary | ICD-10-CM | POA: Diagnosis not present

## 2021-06-10 DIAGNOSIS — Z01419 Encounter for gynecological examination (general) (routine) without abnormal findings: Secondary | ICD-10-CM | POA: Diagnosis not present

## 2021-07-16 DIAGNOSIS — D225 Melanocytic nevi of trunk: Secondary | ICD-10-CM | POA: Diagnosis not present

## 2021-07-16 DIAGNOSIS — L578 Other skin changes due to chronic exposure to nonionizing radiation: Secondary | ICD-10-CM | POA: Diagnosis not present

## 2021-07-16 DIAGNOSIS — L918 Other hypertrophic disorders of the skin: Secondary | ICD-10-CM | POA: Diagnosis not present

## 2021-09-18 DIAGNOSIS — F900 Attention-deficit hyperactivity disorder, predominantly inattentive type: Secondary | ICD-10-CM | POA: Diagnosis not present

## 2021-10-10 DIAGNOSIS — M542 Cervicalgia: Secondary | ICD-10-CM | POA: Diagnosis not present

## 2021-10-10 DIAGNOSIS — Z8349 Family history of other endocrine, nutritional and metabolic diseases: Secondary | ICD-10-CM | POA: Diagnosis not present

## 2021-10-10 DIAGNOSIS — H9202 Otalgia, left ear: Secondary | ICD-10-CM | POA: Diagnosis not present

## 2021-10-10 DIAGNOSIS — E049 Nontoxic goiter, unspecified: Secondary | ICD-10-CM | POA: Diagnosis not present

## 2021-10-15 ENCOUNTER — Other Ambulatory Visit: Payer: Self-pay | Admitting: Family Medicine

## 2021-10-15 DIAGNOSIS — M542 Cervicalgia: Secondary | ICD-10-CM

## 2021-10-15 DIAGNOSIS — E049 Nontoxic goiter, unspecified: Secondary | ICD-10-CM

## 2021-10-28 ENCOUNTER — Ambulatory Visit
Admission: RE | Admit: 2021-10-28 | Discharge: 2021-10-28 | Disposition: A | Discharge status: Home / Self Care | Payer: BC Managed Care – PPO | Source: Ambulatory Visit | Attending: Family Medicine | Admitting: Family Medicine

## 2021-10-28 DIAGNOSIS — E041 Nontoxic single thyroid nodule: Secondary | ICD-10-CM | POA: Diagnosis not present

## 2021-10-28 DIAGNOSIS — M542 Cervicalgia: Secondary | ICD-10-CM

## 2021-10-28 DIAGNOSIS — E049 Nontoxic goiter, unspecified: Secondary | ICD-10-CM

## 2021-11-04 ENCOUNTER — Other Ambulatory Visit: Payer: Self-pay | Admitting: Family Medicine

## 2021-11-04 DIAGNOSIS — E041 Nontoxic single thyroid nodule: Secondary | ICD-10-CM

## 2021-11-06 ENCOUNTER — Other Ambulatory Visit (HOSPITAL_COMMUNITY)
Admission: RE | Admit: 2021-11-06 | Discharge: 2021-11-06 | Disposition: A | Discharge status: Home / Self Care | Payer: BC Managed Care – PPO | Source: Ambulatory Visit | Attending: Family Medicine | Admitting: Family Medicine

## 2021-11-06 ENCOUNTER — Ambulatory Visit
Admission: RE | Admit: 2021-11-06 | Discharge: 2021-11-06 | Disposition: A | Discharge status: Home / Self Care | Payer: BC Managed Care – PPO | Source: Ambulatory Visit | Attending: Family Medicine | Admitting: Family Medicine

## 2021-11-06 DIAGNOSIS — E041 Nontoxic single thyroid nodule: Secondary | ICD-10-CM

## 2021-11-08 LAB — CYTOLOGY - NON PAP

## 2021-11-11 ENCOUNTER — Other Ambulatory Visit: Payer: BC Managed Care – PPO

## 2021-11-13 DIAGNOSIS — E041 Nontoxic single thyroid nodule: Secondary | ICD-10-CM | POA: Diagnosis not present

## 2021-11-13 DIAGNOSIS — R499 Unspecified voice and resonance disorder: Secondary | ICD-10-CM | POA: Diagnosis not present

## 2021-11-13 DIAGNOSIS — Z8639 Personal history of other endocrine, nutritional and metabolic disease: Secondary | ICD-10-CM | POA: Diagnosis not present

## 2021-11-13 DIAGNOSIS — R9389 Abnormal findings on diagnostic imaging of other specified body structures: Secondary | ICD-10-CM | POA: Diagnosis not present

## 2021-11-13 DIAGNOSIS — Z8349 Family history of other endocrine, nutritional and metabolic diseases: Secondary | ICD-10-CM | POA: Diagnosis not present

## 2021-12-11 DIAGNOSIS — E041 Nontoxic single thyroid nodule: Secondary | ICD-10-CM | POA: Diagnosis not present

## 2022-01-16 DIAGNOSIS — E041 Nontoxic single thyroid nodule: Secondary | ICD-10-CM | POA: Diagnosis not present

## 2022-02-09 DIAGNOSIS — L259 Unspecified contact dermatitis, unspecified cause: Secondary | ICD-10-CM | POA: Diagnosis not present

## 2022-02-09 DIAGNOSIS — B354 Tinea corporis: Secondary | ICD-10-CM | POA: Diagnosis not present

## 2022-02-12 DIAGNOSIS — L42 Pityriasis rosea: Secondary | ICD-10-CM | POA: Diagnosis not present

## 2022-03-31 DIAGNOSIS — Z0000 Encounter for general adult medical examination without abnormal findings: Secondary | ICD-10-CM | POA: Diagnosis not present

## 2022-03-31 DIAGNOSIS — Z131 Encounter for screening for diabetes mellitus: Secondary | ICD-10-CM | POA: Diagnosis not present

## 2022-03-31 DIAGNOSIS — Z13220 Encounter for screening for lipoid disorders: Secondary | ICD-10-CM | POA: Diagnosis not present

## 2022-04-03 DIAGNOSIS — Z0000 Encounter for general adult medical examination without abnormal findings: Secondary | ICD-10-CM | POA: Diagnosis not present

## 2022-06-23 DIAGNOSIS — Z1231 Encounter for screening mammogram for malignant neoplasm of breast: Secondary | ICD-10-CM | POA: Diagnosis not present

## 2022-06-23 DIAGNOSIS — Z124 Encounter for screening for malignant neoplasm of cervix: Secondary | ICD-10-CM | POA: Diagnosis not present

## 2022-06-23 DIAGNOSIS — Z113 Encounter for screening for infections with a predominantly sexual mode of transmission: Secondary | ICD-10-CM | POA: Diagnosis not present

## 2022-06-23 DIAGNOSIS — Z6823 Body mass index (BMI) 23.0-23.9, adult: Secondary | ICD-10-CM | POA: Diagnosis not present

## 2022-06-23 DIAGNOSIS — Z01419 Encounter for gynecological examination (general) (routine) without abnormal findings: Secondary | ICD-10-CM | POA: Diagnosis not present

## 2022-07-17 DIAGNOSIS — L818 Other specified disorders of pigmentation: Secondary | ICD-10-CM | POA: Diagnosis not present

## 2022-07-17 DIAGNOSIS — D225 Melanocytic nevi of trunk: Secondary | ICD-10-CM | POA: Diagnosis not present

## 2022-07-17 DIAGNOSIS — D485 Neoplasm of uncertain behavior of skin: Secondary | ICD-10-CM | POA: Diagnosis not present

## 2022-07-17 DIAGNOSIS — L578 Other skin changes due to chronic exposure to nonionizing radiation: Secondary | ICD-10-CM | POA: Diagnosis not present

## 2022-07-17 DIAGNOSIS — L249 Irritant contact dermatitis, unspecified cause: Secondary | ICD-10-CM | POA: Diagnosis not present

## 2022-07-17 DIAGNOSIS — D2272 Melanocytic nevi of left lower limb, including hip: Secondary | ICD-10-CM | POA: Diagnosis not present

## 2022-08-20 ENCOUNTER — Other Ambulatory Visit: Payer: Self-pay | Admitting: Internal Medicine

## 2022-08-20 DIAGNOSIS — E041 Nontoxic single thyroid nodule: Secondary | ICD-10-CM

## 2022-08-28 DIAGNOSIS — L905 Scar conditions and fibrosis of skin: Secondary | ICD-10-CM | POA: Diagnosis not present

## 2022-08-29 DIAGNOSIS — D485 Neoplasm of uncertain behavior of skin: Secondary | ICD-10-CM | POA: Diagnosis not present

## 2022-09-17 ENCOUNTER — Ambulatory Visit
Admission: RE | Admit: 2022-09-17 | Discharge: 2022-09-17 | Disposition: A | Discharge status: Home / Self Care | Payer: BC Managed Care – PPO | Source: Ambulatory Visit | Attending: Internal Medicine | Admitting: Internal Medicine

## 2022-09-17 DIAGNOSIS — E041 Nontoxic single thyroid nodule: Secondary | ICD-10-CM | POA: Diagnosis not present

## 2022-09-23 DIAGNOSIS — E041 Nontoxic single thyroid nodule: Secondary | ICD-10-CM | POA: Diagnosis not present

## 2022-09-26 ENCOUNTER — Other Ambulatory Visit: Payer: Self-pay | Admitting: Surgery

## 2022-09-26 DIAGNOSIS — E041 Nontoxic single thyroid nodule: Secondary | ICD-10-CM

## 2022-09-29 DIAGNOSIS — E041 Nontoxic single thyroid nodule: Secondary | ICD-10-CM | POA: Diagnosis not present

## 2022-10-22 DIAGNOSIS — J329 Chronic sinusitis, unspecified: Secondary | ICD-10-CM | POA: Diagnosis not present

## 2023-04-30 DIAGNOSIS — F900 Attention-deficit hyperactivity disorder, predominantly inattentive type: Secondary | ICD-10-CM | POA: Diagnosis not present

## 2023-04-30 DIAGNOSIS — Z0000 Encounter for general adult medical examination without abnormal findings: Secondary | ICD-10-CM | POA: Diagnosis not present

## 2023-05-05 DIAGNOSIS — Z13220 Encounter for screening for lipoid disorders: Secondary | ICD-10-CM | POA: Diagnosis not present

## 2023-05-05 DIAGNOSIS — Z0000 Encounter for general adult medical examination without abnormal findings: Secondary | ICD-10-CM | POA: Diagnosis not present

## 2023-05-05 DIAGNOSIS — Z131 Encounter for screening for diabetes mellitus: Secondary | ICD-10-CM | POA: Diagnosis not present

## 2023-05-26 DIAGNOSIS — Z1211 Encounter for screening for malignant neoplasm of colon: Secondary | ICD-10-CM | POA: Diagnosis not present

## 2023-05-26 DIAGNOSIS — K625 Hemorrhage of anus and rectum: Secondary | ICD-10-CM | POA: Diagnosis not present

## 2023-05-26 DIAGNOSIS — Z800 Family history of malignant neoplasm of digestive organs: Secondary | ICD-10-CM | POA: Diagnosis not present

## 2023-06-24 DIAGNOSIS — D122 Benign neoplasm of ascending colon: Secondary | ICD-10-CM | POA: Diagnosis not present

## 2023-06-24 DIAGNOSIS — K625 Hemorrhage of anus and rectum: Secondary | ICD-10-CM | POA: Diagnosis not present

## 2023-06-24 DIAGNOSIS — Z800 Family history of malignant neoplasm of digestive organs: Secondary | ICD-10-CM | POA: Diagnosis not present

## 2023-06-24 DIAGNOSIS — K635 Polyp of colon: Secondary | ICD-10-CM | POA: Diagnosis not present

## 2023-06-24 DIAGNOSIS — Z1211 Encounter for screening for malignant neoplasm of colon: Secondary | ICD-10-CM | POA: Diagnosis not present

## 2023-07-08 DIAGNOSIS — Z01419 Encounter for gynecological examination (general) (routine) without abnormal findings: Secondary | ICD-10-CM | POA: Diagnosis not present

## 2023-07-08 DIAGNOSIS — Z1231 Encounter for screening mammogram for malignant neoplasm of breast: Secondary | ICD-10-CM | POA: Diagnosis not present

## 2023-07-08 DIAGNOSIS — Z6822 Body mass index (BMI) 22.0-22.9, adult: Secondary | ICD-10-CM | POA: Diagnosis not present

## 2023-07-08 DIAGNOSIS — Z124 Encounter for screening for malignant neoplasm of cervix: Secondary | ICD-10-CM | POA: Diagnosis not present

## 2023-07-27 DIAGNOSIS — L918 Other hypertrophic disorders of the skin: Secondary | ICD-10-CM | POA: Diagnosis not present

## 2023-08-20 ENCOUNTER — Other Ambulatory Visit: Payer: Self-pay | Admitting: Internal Medicine

## 2023-08-20 DIAGNOSIS — E041 Nontoxic single thyroid nodule: Secondary | ICD-10-CM

## 2023-08-28 ENCOUNTER — Ambulatory Visit
Admission: RE | Admit: 2023-08-28 | Discharge: 2023-08-28 | Disposition: A | Discharge status: Home / Self Care | Source: Ambulatory Visit | Attending: Internal Medicine | Admitting: Internal Medicine

## 2023-08-28 DIAGNOSIS — L818 Other specified disorders of pigmentation: Secondary | ICD-10-CM | POA: Diagnosis not present

## 2023-08-28 DIAGNOSIS — D225 Melanocytic nevi of trunk: Secondary | ICD-10-CM | POA: Diagnosis not present

## 2023-08-28 DIAGNOSIS — L578 Other skin changes due to chronic exposure to nonionizing radiation: Secondary | ICD-10-CM | POA: Diagnosis not present

## 2023-08-28 DIAGNOSIS — E041 Nontoxic single thyroid nodule: Secondary | ICD-10-CM

## 2023-09-30 DIAGNOSIS — E041 Nontoxic single thyroid nodule: Secondary | ICD-10-CM | POA: Diagnosis not present

## 2023-10-28 DIAGNOSIS — F900 Attention-deficit hyperactivity disorder, predominantly inattentive type: Secondary | ICD-10-CM | POA: Diagnosis not present

## 2023-11-25 DIAGNOSIS — N3001 Acute cystitis with hematuria: Secondary | ICD-10-CM | POA: Diagnosis not present

## 2023-12-02 DIAGNOSIS — R3915 Urgency of urination: Secondary | ICD-10-CM | POA: Diagnosis not present

## 2023-12-02 DIAGNOSIS — N898 Other specified noninflammatory disorders of vagina: Secondary | ICD-10-CM | POA: Diagnosis not present

## 2023-12-29 DIAGNOSIS — T1490XA Injury, unspecified, initial encounter: Secondary | ICD-10-CM | POA: Diagnosis not present

## 2023-12-29 DIAGNOSIS — S6991XA Unspecified injury of right wrist, hand and finger(s), initial encounter: Secondary | ICD-10-CM | POA: Diagnosis not present
